# Patient Record
Sex: Female | Born: 1990 | Race: Black or African American | Hispanic: No | Marital: Married | Smoking: Never smoker
Health system: Southern US, Community
[De-identification: ages and names within clinical notes are randomized; demographics above are authoritative.]

## PROBLEM LIST (undated history)

## (undated) DIAGNOSIS — O26613 Liver and biliary tract disorders in pregnancy, third trimester: Secondary | ICD-10-CM

## (undated) DIAGNOSIS — I456 Pre-excitation syndrome: Secondary | ICD-10-CM

## (undated) DIAGNOSIS — F419 Anxiety disorder, unspecified: Secondary | ICD-10-CM

## (undated) DIAGNOSIS — K831 Obstruction of bile duct: Secondary | ICD-10-CM

## (undated) DIAGNOSIS — Z789 Other specified health status: Secondary | ICD-10-CM

## (undated) DIAGNOSIS — O26643 Intrahepatic cholestasis of pregnancy, third trimester: Secondary | ICD-10-CM

## (undated) DIAGNOSIS — E669 Obesity, unspecified: Secondary | ICD-10-CM

## (undated) DIAGNOSIS — I10 Essential (primary) hypertension: Secondary | ICD-10-CM

## (undated) HISTORY — PX: CARDIAC ELECTROPHYSIOLOGY MAPPING AND ABLATION: SHX1292

## (undated) HISTORY — PX: OTHER SURGICAL HISTORY: SHX169

---
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
## 2018-09-19 ENCOUNTER — Ambulatory Visit
Admission: EM | Admit: 2018-09-19 | Discharge: 2018-09-19 | Disposition: A | Discharge status: Home / Self Care | Payer: Self-pay | Attending: Urgent Care | Admitting: Urgent Care

## 2018-09-19 ENCOUNTER — Other Ambulatory Visit: Payer: Self-pay

## 2018-09-19 DIAGNOSIS — H6123 Impacted cerumen, bilateral: Secondary | ICD-10-CM

## 2018-09-19 DIAGNOSIS — H60503 Unspecified acute noninfective otitis externa, bilateral: Secondary | ICD-10-CM

## 2018-09-19 MED ORDER — NEOMYCIN-POLYMYXIN-HC 3.5-10000-1 OT SUSP: 4.0000 [drp] | Freq: Three times a day (TID) | OTIC | 0 refills | Status: DC

## 2018-09-19 NOTE — Discharge Instructions (Addendum)
It was very nice seeing you today in clinic. Thank you for entrusting me with your care.   Please utilize the medications that we discussed. Your prescriptions have been called in to your pharmacy. May use Tylenol and/or Ibuprofen as needed for pain/fever.   Make arrangements to follow up with your regular doctor in 1 week for re-evaluation if not improving. If your symptoms/condition worsens, please seek follow up care either here or in the ER. Please remember, our [place] providers are "right here with you" when you need [place].   Again, it was my pleasure to take care of you today. Thank you for choosing our clinic. I hope that you start to feel better quickly.   Honor Loh, MSN, APRN, FNP-C, CEN Advanced Practice Provider [facility]

## 2018-09-19 NOTE — ED Triage Notes (Signed)
Pt here for bilateral pain in her ears and both are clogged and has been trying to get them unclogged for 1 week. Has tried debrox and warm olive oil without relief.

## 2018-09-19 NOTE — ED Provider Notes (Signed)
[place], [place]   Name: Suzanne Guzman DOB: Apr 09, 1990 MRN: [id number] CSN: [id number] PCP: Patient, No Pcp Per  Arrival date and time:  09/19/18 1145  Chief Complaint:  Ear Problem   NOTE: Prior to seeing the patient today, I have reviewed the triage nursing documentation and vital signs. Clinical staff has updated patient's PMH/PSHx, current medication list, and drug allergies/intolerances to ensure comprehensive history available to assist in medical decision making.   History:   HPI: Suzanne Guzman is a 28 y.o. female who presents today with complaints of BILATERAL otalgia and decreased ability to hear over the last week. Patient denies any fevers. No recent upper respiratory illnesses of forceful nose blowing. Patient has not experienced any otorrhea. Patient feels like her ears are "clogged up". (+) tinnitus. She has been using Debrox and warm olive oil, however she has not appreciated any difference in her symptoms. Patient denies the use of cotton tip swabs. She does not have a history of ear problems or tympanostomy tubes.   History reviewed. No pertinent past medical history.  Past Surgical History:  Procedure Laterality Date   ablation     heart    Family History  Problem Relation Age of Onset   Cancer Mother    Other Father        MDS    Social History   Tobacco Use   Smoking status: Never Smoker   Smokeless tobacco: Never Used  Substance Use Topics   Alcohol use: Yes   Drug use: Not on file    There are no active problems to display for this patient.   Home Medications:    No outpatient medications have been marked as taking for the 09/19/18 encounter [facility] Encounter).    Allergies:   Penicillins  Review of Systems (ROS): Review of Systems  Constitutional: Negative for chills and fever.  HENT: Positive for ear pain, hearing loss (decreased) and tinnitus. Negative for congestion, ear discharge, rhinorrhea, sinus pressure, sinus pain and sore  throat.   Respiratory: Negative for cough and shortness of breath.   Cardiovascular: Negative for chest pain and palpitations.  Musculoskeletal: Negative for neck pain.  Skin: Negative.   Neurological: Negative for dizziness and headaches.  Hematological: Negative for adenopathy.     Vital Signs: Today's Vitals   09/19/18 1221 09/19/18 1223 09/19/18 1332  BP: (!) 148/91    Pulse: 95    Resp: 18    Temp: 99.1 F (37.3 C)    TempSrc: Oral    SpO2: 100%    Weight:  190 lb (86.2 kg)   Height:  5\' 3"  (1.6 m)   PainSc:  0-No pain 0-No pain    Physical Exam: Physical Exam  Constitutional: She is oriented to person, place, and time and well-developed, well-nourished, and in no distress.  HENT:  Head: Normocephalic and atraumatic.  Right Ear: There is tenderness. No drainage. Decreased hearing is noted.  Left Ear: There is tenderness. No drainage. Decreased hearing is noted.  Nose: Nose normal. No sinus tenderness.  Mouth/Throat: Uvula is midline, oropharynx is clear and moist and mucous membranes are normal.  EAC fully occluded by impacted cerumen BILATERALLY  Eyes: Pupils are equal, round, and reactive to light. EOM are normal.  Neck: Normal range of motion. Neck supple.  Cardiovascular: Normal rate, regular rhythm, normal heart sounds and intact distal pulses. Exam reveals no gallop and no friction rub.  No murmur heard. Pulmonary/Chest: Effort normal and breath sounds normal. No respiratory distress. She  has no wheezes. She has no rales.  Lymphadenopathy:    She has no cervical adenopathy.  Neurological: She is alert and oriented to person, place, and time. Gait normal. GCS score is 15.  Skin: Skin is warm and dry. No rash noted. No erythema.  Psychiatric: Mood, memory, affect and judgment normal.  Nursing note and vitals reviewed.   Urgent Care Treatments / Results:   LABS: PLEASE NOTE: all labs that were ordered this encounter are listed, however only abnormal results  are displayed. Labs Reviewed - No data to display  EKG: -None  RADIOLOGY: No results found.  PROCEDURES: Ear Cerumen Removal Performed by: Karen Kitchens, NP Authorized by: Karen Kitchens, NP   Consent:    Consent obtained:  Verbal   Consent given by:  Patient   Risks discussed:  Bleeding, infection, pain, dizziness, incomplete removal and TM perforation   Alternatives discussed:  Delayed treatment, alternative treatment and referral Procedure details:    Location:  L ear and R ear   Procedure type comment:  Irrigated with warm water and disimpacted with curette Post-procedure details:    Inspection:  Bleeding, macerated skin and TM intact   Hearing quality:  Improved   Patient tolerance of procedure:  Tolerated well, no immediate complications    MEDICATIONS RECEIVED THIS VISIT: Medications - No data to display  PERTINENT CLINICAL COURSE NOTES/UPDATES:   Initial Impression / Assessment and Plan / Urgent Care Course:  Pertinent labs & imaging results that were available during my care of the patient were personally reviewed by me and considered in my medical decision making (see lab/imaging section of note for values and interpretations).  Suzanne Guzman is a 28 y.o. female who presents to [facility] today with complaints of Ear Problem   Patient overall well appearing and in no acute distress today in clinic. Exam reveals reveals BILATERAL external auditory canal occulusion secondary to cerumen impaction. Ears are painful. No recent illnesses. No fevers. Ears disimpacted as per above. Post-procedural examination of the ears reveals erythematous maceration within the auditory canals. Slight bleeding noted (R>L). Discussed need for treatment for pain and swelling. Reviewed high likelihood of developing external ear infection with the degree of maceration present. Will treat with Cortisporin otic gtts TID x 5 days. May use Tylenol and/or Ibuprofen as needed for pain/fever.  She was encouraged not to introduce anything into the ears other than the drops. Discussed safe ear cleaning, which included avoiding cotton tip swabs.    Discussed follow up with primary care physician in 1 week for re-evaluation. I have reviewed the follow up and strict return precautions for any new or worsening symptoms. Patient is aware of symptoms that would be deemed urgent/emergent, and would thus require further evaluation either here or in the emergency department. At the time of discharge, she verbalized understanding and consent with the discharge plan as it was reviewed with her. All questions were fielded by provider and/or clinic staff prior to patient discharge.    Final Clinical Impressions(s) / Urgent Care Diagnoses:   Final diagnoses:  Bilateral impacted cerumen  Acute otitis externa of both ears, unspecified type    New Prescriptions:   Meds ordered this encounter  Medications   neomycin-polymyxin-hydrocortisone (CORTISPORIN) 3.5-10000-1 OTIC suspension    Sig: Place 4 drops into both ears 3 (three) times daily. X 5 days    Dispense:  10 mL    Refill:  0    Controlled Substance Prescriptions:  [place] Controlled  Substance Registry consulted? Not Applicable  Recommended Follow up Care:  Patient encouraged to follow up with the following provider within the specified time frame, or sooner as dictated by the severity of her symptoms. As always, she was instructed that for any urgent/emergent care needs, she should seek care either here or in the emergency department for more immediate evaluation.  Follow-up Information    PCP In 1 week.          NOTE: This note was prepared using Lobbyist along with smaller Company secretary. Despite my best ability to proofread, there is the potential that transcriptional errors may still occur from this process, and are completely unintentional.     Karen Kitchens, NP 09/20/18 2241

## 2018-11-15 ENCOUNTER — Encounter: Payer: Self-pay | Admitting: Emergency Medicine

## 2018-11-15 ENCOUNTER — Ambulatory Visit
Admission: EM | Admit: 2018-11-15 | Discharge: 2018-11-15 | Disposition: A | Discharge status: Home / Self Care | Payer: Self-pay | Attending: Family Medicine | Admitting: Family Medicine

## 2018-11-15 ENCOUNTER — Other Ambulatory Visit: Payer: Self-pay

## 2018-11-15 DIAGNOSIS — N943 Premenstrual tension syndrome: Secondary | ICD-10-CM

## 2018-11-15 LAB — HCG, QUANTITATIVE, PREGNANCY: hCG, Beta Chain, Quant, S: <1 m[IU]/mL (ref <5)

## 2018-11-15 MED ORDER — MEFENAMIC ACID 250 MG PO CAPS: 500.0000 mg | ORAL_CAPSULE | Freq: Three times a day (TID) | ORAL | 1 refills | Status: DC | PRN

## 2018-11-15 NOTE — Discharge Instructions (Addendum)
Medication as needed.  Fol

## 2018-11-15 NOTE — ED Triage Notes (Signed)
Patient c/o lower mid abdominal pain that started 2-3 days ago.  Patient denies N/V/D.  Patient denies fevers. Patient denies any urinary symptoms.

## 2018-11-15 NOTE — ED Provider Notes (Signed)
[facility]    CSN: [id number] Arrival date & time: 11/15/18  1321   History   Chief Complaint Chief Complaint  Patient presents with  . Abdominal Pain   HPI  28 year old female presents with multiple complaints.  Patient reports that she feels that she is experiencing premenstrual syndrome.  She reports lower abdominal pain which she describes as cramping and pressure.  She reports vaginal aching and breast pain as well.  She states that she is getting ready to start her menstrual cycle on Monday.  She has had similar bouts of this previously but states that the pain feels different.  She has taken several over-the-counter medications without improvement.  Patient states that she is had a negative pregnancy test at home.  She states that her pain is currently 5/10 in severity.  She is requesting something for her abdominal discomfort/cramping.  No fever.  No GI symptoms.  No urinary symptoms.  No other complaints.  PMH, Surgical Hx, Family Hx, Social History reviewed and updated as below.  No significant PMH.  Past Surgical History:  Procedure Laterality Date  . ablation     heart    OB History   No obstetric history on file.      Home Medications    Prior to Admission medications   Medication Sig Start Date End Date Taking? Authorizing Provider  Mefenamic Acid 250 MG CAPS Take 2 capsules (500 mg total) by mouth 3 (three) times daily as needed (Pain). 11/15/18   Coral Spikes, DO    Family History Family History  Problem Relation Age of Onset  . Cancer Mother   . Other Father        MDS    Social History Social History   Tobacco Use  . Smoking status: Never Smoker  . Smokeless tobacco: Never Used  Substance Use Topics  . Alcohol use: Yes  . Drug use: Never     Allergies   Penicillins   Review of Systems Review of Systems  Constitutional: Negative.   Gastrointestinal: Positive for abdominal pain.  Genitourinary: Negative.   Breast pain  Vaginal "achy"  Physical Exam Triage Vital Signs ED Triage Vitals  Enc Vitals Group     BP 11/15/18 1333 (!) 148/97     Pulse Rate 11/15/18 1333 88     Resp 11/15/18 1333 16     Temp 11/15/18 1333 98.7 F (37.1 C)     Temp Source 11/15/18 1333 Oral     SpO2 11/15/18 1333 100 %     Weight 11/15/18 1330 190 lb (86.2 kg)     Height 11/15/18 1330 5\' 3"  (1.6 m)     Head Circumference --      Peak Flow --      Pain Score 11/15/18 1330 5     Pain Loc --      Pain Edu? --      Excl. in [place]? --    Updated Vital Signs BP (!) 148/97 (BP Location: Left Arm)   Pulse 88   Temp 98.7 F (37.1 C) (Oral)   Resp 16   Ht 5\' 3"  (1.6 m)   Wt 86.2 kg   LMP 10/21/2018 (Exact Date)   SpO2 100%   BMI 33.66 kg/m   Visual Acuity Right Eye Distance:   Left Eye Distance:   Bilateral Distance:    Right Eye Near:   Left Eye Near:    Bilateral Near:     Physical Exam Vitals  signs and nursing note reviewed.  Constitutional:      General: She is not in acute distress.    Appearance: Normal appearance.  HENT:     Head: Normocephalic and atraumatic.  Eyes:     General:        Right eye: No discharge.        Left eye: No discharge.     Conjunctiva/sclera: Conjunctivae normal.  Cardiovascular:     Rate and Rhythm: Normal rate and regular rhythm.  Pulmonary:     Effort: Pulmonary effort is normal.     Breath sounds: Normal breath sounds. No wheezing, rhonchi or rales.  Abdominal:     General: There is no distension.     Palpations: Abdomen is soft.     Comments: Tender to palpation in the suprapubic region.  Neurological:     Mental Status: She is alert.  Psychiatric:        Mood and Affect: Mood normal.        Behavior: Behavior normal.     UC Treatments / Results  Labs (all labs ordered are listed, but only abnormal results are displayed) Labs Reviewed  HCG, QUANTITATIVE, PREGNANCY    EKG   Radiology No results found.  Procedures Procedures (including critical care  time)  Medications Ordered in UC Medications - No data to display  Initial Impression / Assessment and Plan / UC Course  I have reviewed the triage vital signs and the nursing notes.  Pertinent labs & imaging results that were available during my care of the patient were reviewed by me and considered in my medical decision making (see chart for details).    28 year old female presents with premenstrual syndrome.  Mefenamic acid as needed.  Pregnancy test negative today.  Supportive care.  Final Clinical Impressions(s) / UC Diagnoses   Final diagnoses:  Premenstrual syndrome     Discharge Instructions     Medication as needed.  [facility]   ED Prescriptions    Medication Sig Dispense Auth. Provider   Mefenamic Acid 250 MG CAPS Take 2 capsules (500 mg total) by mouth 3 (three) times daily as needed (Pain). 30 capsule Coral Spikes, DO     Controlled Substance Prescriptions [place] Controlled Substance Registry consulted? Not Applicable   Coral Spikes, DO 11/15/18 1515

## 2019-02-18 ENCOUNTER — Telehealth: Payer: Self-pay | Admitting: Obstetrics & Gynecology

## 2019-02-18 NOTE — Telephone Encounter (Signed)
Patient called after hour nurse line on 02/12/19 at 6:46 requesting an appointment for NOB. Our office was closed due to thanksgiving hoilday. I attempt to reach patient to schedule appointment no answer left voice mail for her to call back to be schedule.

## 2019-02-24 ENCOUNTER — Telehealth: Payer: Self-pay

## 2019-02-24 NOTE — Telephone Encounter (Signed)
Pt calling; is 6w preg; has initial appt 12/24; yesterday am had pink d/c; later in day with wiping had two episodes of brown d/c; this am had a little bit of brown d/c with back and abd cramping.  [phone]  Pt states had IC yesterday before this started; states the brown later in the day was chunkier; with the pink d/c there was not chunks. Adv all of this is probably d/t IC.  Pt asked if appt could be moved up sooner.  Tx'd to [facility] for cancellation list.

## 2019-02-26 ENCOUNTER — Telehealth: Payer: Self-pay

## 2019-02-26 NOTE — Telephone Encounter (Signed)
Can you call her and give her CLG advice please. I dot have a phone out here

## 2019-02-26 NOTE — Telephone Encounter (Signed)
Pt calling triage reporting pain on the Right side pt is [redacted] weeks pregnant, it only last a few min's, but if she sits wrong or gets up fast she feels the pain, we have no openings please advise

## 2019-02-26 NOTE — Telephone Encounter (Signed)
Pt aware. Pt asked if she goes to urgent care if they will be able to see whats going on in there. Advised as far as ultrasounds, I do not know if they have access there. Advised to call before deciding to go to urgent care.

## 2019-02-26 NOTE — Telephone Encounter (Signed)
Please have her go to the ER or urgent care if she needs to be seen prior to her NOB appointment

## 2019-03-03 DIAGNOSIS — O26851 Spotting complicating pregnancy, first trimester: Secondary | ICD-10-CM | POA: Diagnosis not present

## 2019-03-03 DIAGNOSIS — J45909 Unspecified asthma, uncomplicated: Secondary | ICD-10-CM | POA: Diagnosis not present

## 2019-03-03 DIAGNOSIS — O99511 Diseases of the respiratory system complicating pregnancy, first trimester: Secondary | ICD-10-CM | POA: Diagnosis not present

## 2019-03-03 DIAGNOSIS — O99411 Diseases of the circulatory system complicating pregnancy, first trimester: Secondary | ICD-10-CM | POA: Diagnosis not present

## 2019-03-03 DIAGNOSIS — Z3A01 Less than 8 weeks gestation of pregnancy: Secondary | ICD-10-CM | POA: Diagnosis not present

## 2019-03-03 DIAGNOSIS — I456 Pre-excitation syndrome: Secondary | ICD-10-CM | POA: Diagnosis not present

## 2019-03-03 DIAGNOSIS — O209 Hemorrhage in early pregnancy, unspecified: Secondary | ICD-10-CM | POA: Diagnosis not present

## 2019-03-11 ENCOUNTER — Telehealth: Payer: Self-pay

## 2019-03-11 NOTE — Telephone Encounter (Signed)
Returned Suzanne Guzman's call and her questions were answered. Had an ultrasound last week at [facility] a [facility] with [place] 6wk4d with +FCA. Had two small fibroids. No adnexal masses. Having some mild cramping with some white discharge. Wet prep and GC/Chlamydia tests at [facility] last week were negative. Advised cramping in early pregnancy may be normal. Questions regarding practice at [facility] were answered. Dalia Heading, CNM

## 2019-03-11 NOTE — Telephone Encounter (Signed)
Pt feels cramps in the front of her stomach and a white discharge, no odor , pt aware this is fine, as long as the pain is not severe or no bleeding. Cramps can be normal in the beginning of pregnancy along with a heavy discharge  she has an appointment in 2 days, wanted me to send to a DR to make sure there wasn't anything else that could be going on.

## 2019-03-13 ENCOUNTER — Encounter: Payer: Self-pay | Admitting: Advanced Practice Midwife

## 2019-03-13 ENCOUNTER — Other Ambulatory Visit: Payer: Self-pay

## 2019-03-13 ENCOUNTER — Encounter

## 2019-03-13 ENCOUNTER — Ambulatory Visit (INDEPENDENT_AMBULATORY_CARE_PROVIDER_SITE_OTHER): Payer: BLUE CROSS/BLUE SHIELD | Admitting: Advanced Practice Midwife

## 2019-03-13 ENCOUNTER — Other Ambulatory Visit (HOSPITAL_COMMUNITY): Admission: RE | Admit: 2019-03-13 | Payer: BLUE CROSS/BLUE SHIELD | Source: Ambulatory Visit

## 2019-03-13 VITALS — BP 145/88 | HR 88 | Wt 214.0 lb

## 2019-03-13 DIAGNOSIS — Z3A08 8 weeks gestation of pregnancy: Secondary | ICD-10-CM

## 2019-03-13 DIAGNOSIS — Z131 Encounter for screening for diabetes mellitus: Secondary | ICD-10-CM

## 2019-03-13 DIAGNOSIS — Z3480 Encounter for supervision of other normal pregnancy, unspecified trimester: Secondary | ICD-10-CM | POA: Diagnosis not present

## 2019-03-13 DIAGNOSIS — O99210 Obesity complicating pregnancy, unspecified trimester: Secondary | ICD-10-CM

## 2019-03-13 DIAGNOSIS — Z124 Encounter for screening for malignant neoplasm of cervix: Secondary | ICD-10-CM

## 2019-03-13 DIAGNOSIS — O0990 Supervision of high risk pregnancy, unspecified, unspecified trimester: Secondary | ICD-10-CM | POA: Insufficient documentation

## 2019-03-13 NOTE — Progress Notes (Signed)
New Obstetric Patient H&P    Chief Complaint: "Desires prenatal care"   History of Present Illness: Patient is a 28 y.o. G2P0010 Not Hispanic or Latino female, presents with amenorrhea and positive home pregnancy test. Patient's last menstrual period was 01/14/2019 (exact date). and based on her  LMP, her EDD is Estimated Date of Delivery: 10/21/19 and her EGA is [redacted]w[redacted]d. Cycles are 6. days, regular, and occur approximately every : 28 days. Her last pap smear was 2 or 3 years ago in [place] and was no abnormalities.    She had a urine pregnancy test which was positive 4 week(s)  ago. Her last menstrual period was normal and lasted for  5 or 6 day(s). Since her LMP she claims she has experienced breast tenderness, fatigue, nausea. She had spotting a week and a half ago and was seen at [facility]. Ultrasound was done dating IUP at [redacted]w[redacted]d with cardiac activity. She also had some labs done including Aptima. Her past medical history is contributory for Wolff-Parkinson White Syndrome with cardiac ablation 2007.She does not have any restrictions per cardiologist. Her prior pregnancies are notable for TAB in 2016.  Since her LMP, she admits to the use of tobacco products  no She claims she has gained  4 pounds since the start of her pregnancy.  There are cats in the home in the home  no  She admits close contact with children on a regular basis  yes teacher at daycare She has had chicken pox in the past yes She has had Tuberculosis exposures, symptoms, or previously tested positive for TB   no Current or past history of domestic violence. no  Genetic Screening/Teratology Counseling: (Includes patient, baby's father, or anyone in either family with:)   44. Patient's age >/= 25 at [facility]  no 2. Thalassemia ([place], [place], [place], or Asian background): MCV<80  no 3. Neural tube defect (meningomyelocele, spina bifida, anencephaly)  no 4. Congenital heart defect  no  5. Down syndrome  no 6. Tay-Sachs (Jewish,  [place])  no 7. Canavan's Disease  no 8. Sickle cell disease or trait (African)  no  9. Hemophilia or other blood disorders  no  10. Muscular dystrophy  no  11. Cystic fibrosis  no  12. Huntington's Chorea  no  13. Mental retardation/autism  no 14. Other inherited genetic or chromosomal disorder  no 15. Maternal metabolic disorder (DM, PKU, etc)  no 16. Patient or FOB with a child with a birth defect not listed above no  16a. Patient or FOB with a birth defect themselves no 17. Recurrent pregnancy loss, or stillbirth  no  18. Any medications since LMP other than prenatal vitamins (include vitamins, supplements, OTC meds, drugs, alcohol)  no 19. Any other genetic/environmental exposure to discuss  no  Infection History:   1. Lives with someone with TB or TB exposed  no  2. Patient or partner has history of genital herpes  no 3. Rash or viral illness since LMP  no 4. History of STI (GC, CT, HPV, syphilis, HIV)  no 5. History of recent travel :  no  Other pertinent information:  She denies a history of chronic or essential hypertension   Review of Systems:10 point review of systems negative unless otherwise noted in HPI  Past Medical History:  History reviewed. No pertinent past medical history.  Past Surgical History:  Past Surgical History:  Procedure Laterality Date  . ablation     heart    Gynecologic  History: Patient's last menstrual period was 01/14/2019 (exact date).  Obstetric History: G2P0010  Family History:  Family History  Problem Relation Age of Onset  . Cancer- Breast Mother 62  . Other Father        MDS    Social History:  Social History   Socioeconomic History  . Marital status: Single    Spouse name: Not on file  . Number of children: Not on file  . Years of education: Not on file  . Highest education level: Not on file  Occupational History  . Not on file  Tobacco Use  . Smoking status: Never Smoker  . Smokeless tobacco: Never  Used  Substance and Sexual Activity  . Alcohol use: Yes  . Drug use: Never  . Sexual activity: Not on file  Other Topics Concern  . Not on file  Social History Narrative  . Not on file   Social Determinants of Health   Financial Resource Strain:   . Difficulty of Paying Living Expenses: Not on file  Food Insecurity:   . Worried About Charity fundraiser in the Last Year: Not on file  . Ran Out of Food in the Last Year: Not on file  Transportation Needs:   . Lack of Transportation (Medical): Not on file  . Lack of Transportation (Non-Medical): Not on file  Physical Activity:   . Days of Exercise per Week: Not on file  . Minutes of Exercise per Session: Not on file  Stress:   . Feeling of Stress : Not on file  Social Connections:   . Frequency of Communication with Friends and Family: Not on file  . Frequency of Social Gatherings with Friends and Family: Not on file  . Attends Religious Services: Not on file  . Active Member of Clubs or Organizations: Not on file  . Attends Archivist Meetings: Not on file  . Marital Status: Not on file  Intimate Partner Violence:   . Fear of Current or Ex-Partner: Not on file  . Emotionally Abused: Not on file  . Physically Abused: Not on file  . Sexually Abused: Not on file    Allergies:  Allergies  Allergen Reactions  . Nutmeg Oil (Myristica Oil) Anaphylaxis    Allergy to NUTS  . Penicillins Anaphylaxis    Medications: Prior to Admission medications   Not on File    Physical Exam Vitals: Blood pressure (!) 145/88, pulse 88, weight 214 lb (97.1 kg), last menstrual period 01/14/2019. Recheck of blood pressure 138/86  General: NAD HEENT: normocephalic, anicteric Thyroid: no enlargement, no palpable nodules Pulmonary: No increased work of breathing, CTAB Cardiovascular: RRR, distal pulses 2+ Abdomen: NABS, soft, non-tender, non-distended.  Umbilicus without lesions.  No hepatomegaly, splenomegaly or masses palpable.  No evidence of hernia  Genitourinary:  External: Normal external female genitalia.  Normal urethral meatus, normal  Bartholin's and Skene's glands.    Vagina: Normal vaginal mucosa, no evidence of prolapse.    Cervix: Grossly normal in appearance, no bleeding, no CMT  Uterus:  Non-enlarged, mobile, normal contour.    Adnexa: ovaries non-enlarged, no adnexal masses  Rectal: deferred Extremities: no edema, erythema, or tenderness Neurologic: Grossly intact Psychiatric: mood appropriate, affect full   The following were addressed during this visit:  Breastfeeding Education - Early initiation of breastfeeding    Comments: Keeps milk supply adequate, helps contract uterus and slow bleeding, and early milk is the perfect first food and is easy to digest.   -  The importance of exclusive breastfeeding    Comments: Provides antibodies, Lower risk of breast and ovarian cancers, and type-2 diabetes,Helps your body recover, Reduced chance of SIDS.   - Risks of giving your baby anything other than breast milk if you are breastfeeding    Comments: Make the baby less content with breastfeeds, may make my baby more susceptible to illness, and may reduce my milk supply.   - The importance of early skin-to-skin contact    Comments: Keeps baby warm and secure, helps keep baby's blood sugar up and breathing steady, easier to bond and breastfeed, and helps calm baby.  - Feeding on demand or baby-led feeding    Comments: Helps prevent breastfeeding complications, helps bring in good milk supply, prevents under or overfeeding, and helps baby feel content and satisfied   - Frequent feeding to help assure optimal milk production    Comments: Making a full supply of milk requires frequent removal of milk from breasts, infant will eat 8-12 times in 24 hours, if separated from infant use breast massage, hand expression and/ or pumping to remove milk from breasts.   - Effective positioning and attachment     Comments: Helps my baby to get enough breast milk, helps to produce an adequate milk supply, and helps prevent nipple pain and damage   - Exclusive breastfeeding for the first 6 months    Comments: Builds a healthy milk supply and keeps it up, protects baby from sickness and disease, and breastmilk has everything your baby needs for the first 6 months.   Assessment: 28 y.o. G2P0010 at [redacted]w[redacted]d presenting to initiate prenatal care  Plan: 1) Avoid alcoholic beverages. 2) Patient encouraged not to smoke.  3) Discontinue the use of all non-medicinal drugs and chemicals.  4) Take prenatal vitamins daily.  5) Nutrition, food safety (fish, cheese advisories, and high nitrite foods) and exercise discussed. 6) Hospital and practice style discussed with cross coverage system.  7) Genetic Screening, such as with 1st Trimester Screening, cell free fetal DNA, AFP testing, and Ultrasound, as well as with amniocentesis and CVS as appropriate, is discussed with patient. At the conclusion of today's visit patient requested genetic testing 8) Patient is asked about travel to areas at risk for the Zika virus, and counseled to avoid travel and exposure to mosquitoes or sexual partners who may have themselves been exposed to the virus. Testing is discussed, and will be ordered as appropriate.  9) PAP, urine culture today 10) Return to clinic in 2 weeks for viability scan, early 1 hr gtt, NOB panel, sickle cell screen, MaterniT 21 if dates agree 27) Consider baseline PIH labs for elevated BP at NOB 12) Consider cardiac Echo for history of [place] 03/13/2019, 11:02 AM

## 2019-03-13 NOTE — Progress Notes (Signed)
NOB Nausea B/P recheck 138/86

## 2019-03-13 NOTE — Patient Instructions (Signed)
Morning Sickness  Morning sickness is when a woman feels nauseous during pregnancy. This nauseous feeling may or may not come with vomiting. It often occurs in the morning, but it can be a problem at any time of day. Morning sickness is most common during the first trimester. In some cases, it may continue throughout pregnancy. Although morning sickness is unpleasant, it is usually harmless unless the woman develops severe and continual vomiting (hyperemesis gravidarum), a condition that requires more intense treatment. What are the causes? The exact cause of this condition is not known, but it seems to be related to normal hormonal changes that occur in pregnancy. What increases the risk? You are more likely to develop this condition if:  You experienced nausea or vomiting before your pregnancy.  You had morning sickness during a previous pregnancy.  You are pregnant with more than one baby, such as twins. What are the signs or symptoms? Symptoms of this condition include:  Nausea.  Vomiting. How is this diagnosed? This condition is usually diagnosed based on your signs and symptoms. How is this treated? In many cases, treatment is not needed for this condition. Making some changes to what you eat may help to control symptoms. Your health care provider may also prescribe or recommend:  Vitamin B6 supplements.  Anti-nausea medicines.  Ginger. Follow these instructions at home: Medicines  Take over-the-counter and prescription medicines only as told by your health care provider. Do not use any prescription, over-the-counter, or herbal medicines for morning sickness without first talking with your health care provider.  Taking multivitamins before getting pregnant can prevent or decrease the severity of morning sickness in most women. Eating and drinking  Eat a piece of dry toast or crackers before getting out of bed in the morning.  Eat 5 or 6 small meals a day.  Eat dry and  bland foods, such as rice or a baked potato. Foods that are high in carbohydrates are often helpful.  Avoid greasy, fatty, and spicy foods.  Have someone cook for you if the smell of any food causes nausea and vomiting.  If you feel nauseous after taking prenatal vitamins, take the vitamins at night or with a snack.  Snack on protein foods between meals if you are hungry. Nuts, yogurt, and cheese are good options.  Drink fluids throughout the day.  Try ginger ale made with real ginger, ginger tea made from fresh grated ginger, or ginger candies. General instructions  Do not use any products that contain nicotine or tobacco, such as cigarettes and e-cigarettes. If you need help quitting, ask your health care provider.  Get an air purifier to keep the air in your house free of odors.  Get plenty of fresh air.  Try to avoid odors that trigger your nausea.  Consider trying these methods to help relieve symptoms: ? Wearing an acupressure wristband. These wristbands are often worn for seasickness. ? Acupuncture. Contact a health care provider if:  Your home remedies are not working and you need medicine.  You feel dizzy or light-headed.  You are losing weight. Get help right away if:  You have persistent and uncontrolled nausea and vomiting.  You faint.  You have severe pain in your abdomen. Summary  Morning sickness is when a woman feels nauseous during pregnancy. This nauseous feeling may or may not come with vomiting.  Morning sickness is most common during the first trimester.  It often occurs in the morning, but it can be a problem at  any time of day.  In many cases, treatment is not needed for this condition. Making some changes to what you eat may help to control symptoms. This information is not intended to replace advice given to you by your health care provider. Make sure you discuss any questions you have with your health care provider. Document Released:  04/27/2006 Document Revised: 02/16/2017 Document Reviewed: 04/08/2016 Elsevier Patient Education  2020 [employer]. Exercise During Pregnancy Exercise is an important part of being healthy for people of all ages. Exercise improves the function of your heart and lungs and helps you maintain strength, flexibility, and a healthy body weight. Exercise also boosts energy levels and elevates mood. Most women should exercise regularly during pregnancy. In rare cases, women with certain medical conditions or complications may be asked to limit or avoid exercise during pregnancy. How does this affect me? Along with maintaining general strength and flexibility, exercising during pregnancy can help:  Keep strength in muscles that are used during labor and childbirth.  Decrease low back pain.  Reduce symptoms of depression.  Control weight gain during pregnancy.  Reduce the risk of needing insulin if you develop diabetes during pregnancy.  Decrease the risk of cesarean delivery.  Speed up your recovery after giving birth. How does this affect my baby? Exercise can help you have a healthy pregnancy. Exercise does not cause premature birth. It will not cause your baby to weigh less at birth. What exercises can I do? Many exercises are safe for you to do during pregnancy. Do a variety of exercises that safely increase your heart and breathing rates and help you build and maintain muscle strength. Do exercises exactly as told by your health care provider. You may do these exercises:  Walking or hiking.  Swimming.  Water aerobics.  Riding a stationary bike.  Strength training.  Modified yoga or Pilates. Tell your instructor that you are pregnant. Avoid overstretching, and avoid lying on your back for long periods of time.  Running or jogging. Only choose this type of exercise if you: ? Ran or jogged regularly before your pregnancy. ? Can run or jog and still talk in complete sentences. What  exercises should I avoid? Depending on your level of fitness and whether you exercised regularly before your pregnancy, you may be told to limit high-intensity exercise. You can tell that you are exercising at a high intensity if you are breathing much harder and faster and cannot hold a conversation while exercising. You must avoid:  Contact sports.  Activities that put you at risk for falling on or being hit in the belly, such as downhill skiing, water skiing, surfing, rock climbing, cycling, gymnastics, and horseback riding.  Scuba diving.  Skydiving.  Yoga or Pilates in a room that is heated to high temperatures.  Jogging or running, unless you ran or jogged regularly before your pregnancy. While jogging or running, you should always be able to talk in full sentences. Do not run or jog so fast that you are unable to have a conversation.  Do not exercise at more than 6,000 feet above sea level (high elevation) if you are not used to exercising at high elevation. How do I exercise in a safe way?   Avoid overheating. Do not exercise in very high temperatures.  Wear loose-fitting, breathable clothes.  Avoid dehydration. Drink enough water before, during, and after exercise to keep your urine pale yellow.  Avoid overstretching. Because of hormone changes during pregnancy, it is easy to  overstretch muscles, tendons, and ligaments during pregnancy.  Start slowly and ask your health care provider to recommend the types of exercise that are safe for you.  Do not exercise to lose weight. Follow these instructions at home:  Exercise on most days or all days of the week. Try to exercise for 30 minutes a day, 5 days a week, unless your health care provider tells you not to.  If you actively exercised before your pregnancy and you are healthy, your health care provider may tell you to continue to do moderate to high-intensity exercise.  If you are just starting to exercise or did not  exercise much before your pregnancy, your health care provider may tell you to do low to moderate-intensity exercise. Questions to ask your health care provider  Is exercise safe for me?  What are signs that I should stop exercising?  Does my health condition mean that I should not exercise during pregnancy?  When should I avoid exercising during pregnancy? Stop exercising and contact a health care provider if: You have any unusual symptoms, such as:  Mild contractions of the uterus or cramps in the abdomen.  Dizziness that does not go away when you rest. Stop exercising and get help right away if: You have any unusual symptoms, such as:  Sudden, severe pain in your low back or your belly.  Mild contractions of the uterus or cramps in the abdomen that do not improve with rest and drinking fluids.  Chest pain.  Bleeding or fluid leaking from your vagina.  Shortness of breath. These symptoms may represent a serious problem that is an emergency. Do not wait to see if the symptoms will go away. Get medical help right away. Call your local emergency services (911 in the [place] Do not drive yourself to the hospital. Summary  Most women should exercise regularly throughout pregnancy. In rare cases, women with certain medical conditions or complications may be asked to limit or avoid exercise during pregnancy.  Do not exercise to lose weight during pregnancy.  Your health care provider will tell you what level of physical activity is right for you.  Stop exercising and contact a health care provider if you have mild contractions of the uterus or cramps in the abdomen. Get help right away if these contractions or cramps do not improve with rest and drinking fluids.  Stop exercising and get help right away if you have sudden, severe pain in your low back or belly, chest pain, shortness of breath, or bleeding or leaking of fluid from your vagina. This information is not intended to  replace advice given to you by your health care provider. Make sure you discuss any questions you have with your health care provider. Document Released: 03/06/2005 Document Revised: 06/27/2018 Document Reviewed: 04/10/2018 Elsevier Patient Education  2020 [place] for Pregnant Women While you are pregnant, your body requires additional nutrition to help support your growing baby. You also have a higher need for some vitamins and minerals, such as folic acid, calcium, iron, and vitamin D. Eating a healthy, well-balanced diet is very important for your health and your baby's health. Your need for extra calories varies for the three 1-month segments of your pregnancy (trimesters). For most women, it is recommended to consume:  150 extra calories a day during the first trimester.  300 extra calories a day during the second trimester.  300 extra calories a day during the third trimester. What are tips for following this  plan?   Do not try to lose weight or go on a diet during pregnancy.  Limit your overall intake of foods that have "empty calories." These are foods that have little nutritional value, such as sweets, desserts, candies, and sugar-sweetened beverages.  Eat a variety of foods (especially fruits and vegetables) to get a full range of vitamins and minerals.  Take a prenatal vitamin to help meet your additional vitamin and mineral needs during pregnancy, specifically for folic acid, iron, calcium, and vitamin D.  Remember to stay active. Ask your health care provider what types of exercise and activities are safe for you.  Practice good food safety and cleanliness. Wash your hands before you eat and after you prepare raw meat. Wash all fruits and vegetables well before peeling or eating. Taking these actions can help to prevent food-borne illnesses that can be very dangerous to your baby, such as listeriosis. Ask your health care provider for more information about  listeriosis. What does 150 extra calories look like? Healthy options that provide 150 extra calories each day could be any of the following:  6-8 oz (170-230 g) of plain low-fat yogurt with  cup of berries.  1 apple with 2 teaspoons (11 g) of peanut butter.  Cut-up vegetables with  cup (60 g) of hummus.  8 oz (230 mL) or 1 cup of low-fat chocolate milk.  1 stick of string cheese with 1 medium orange.  1 peanut butter and jelly sandwich that is made with one slice of whole-wheat bread and 1 tsp (5 g) of peanut butter. For 300 extra calories, you could eat two of those healthy options each day. What is a healthy amount of weight to gain? The right amount of weight gain for you is based on your BMI before you became pregnant. If your BMI:  Was less than 18 (underweight), you should gain 28-40 lb (13-18 kg).  Was 18-24.9 (normal), you should gain 25-35 lb (11-16 kg).  Was 25-29.9 (overweight), you should gain 15-25 lb (7-11 kg).  Was 30 or greater (obese), you should gain 11-20 lb (5-9 kg). What if I am having twins or multiples? Generally, if you are carrying twins or multiples:  You may need to eat 300-600 extra calories a day.  The recommended range for total weight gain is 25-54 lb (11-25 kg), depending on your BMI before pregnancy.  Talk with your health care provider to find out about nutritional needs, weight gain, and exercise that is right for you. What foods can I eat?  Grains All grains. Choose whole grains, such as whole-wheat bread, oatmeal, or brown rice. Vegetables All vegetables. Eat a variety of colors and types of vegetables. Remember to wash your vegetables well before peeling or eating. Fruits All fruits. Eat a variety of colors and types of fruit. Remember to wash your fruits well before peeling or eating. Meats and other protein foods Lean meats, including chicken, [place], fish, and lean cuts of beef, veal, or pork. If you eat fish or seafood, choose  options that are higher in omega-3 fatty acids and lower in mercury, such as salmon, herring, mussels, trout, sardines, pollock, shrimp, crab, and lobster. Tofu. Tempeh. Beans. Eggs. Peanut butter and other nut butters. Make sure that all meats, poultry, and eggs are cooked to food-safe temperatures or "well-done." Two or more servings of fish are recommended each week in order to get the most benefits from omega-3 fatty acids that are found in seafood. Choose fish that are lower  in mercury. You can find more information online:  [employer] Dairy Pasteurized milk and milk alternatives (such as almond milk). Pasteurized yogurt and pasteurized cheese. Cottage cheese. Sour cream. Beverages Water. Juices that contain 100% fruit juice or vegetable juice. Caffeine-free teas and decaffeinated coffee. Drinks that contain caffeine are okay to drink, but it is better to avoid caffeine. Keep your total caffeine intake to less than 200 mg each day (which is 12 oz or 355 mL of coffee, tea, or soda) or the limit as told by your health care provider. Fats and oils Fats and oils are okay to include in moderation. Sweets and desserts Sweets and desserts are okay to include in moderation. Seasoning and other foods All pasteurized condiments. The items listed above may not be a complete list of recommended foods and beverages. Contact your dietitian for more options. The items listed above may not be a complete list of foods and beverages [you/your child] can eat. Contact a dietitian for more information. What foods are not recommended? Vegetables Raw (unpasteurized) vegetable juices. Fruits Unpasteurized fruit juices. Meats and other protein foods Lunch meats, bologna, hot dogs, or other deli meats. (If you must eat those meats, reheat them until they are steaming hot.) Refrigerated pat, meat spreads from a meat counter, smoked seafood that is found in the refrigerated section of a store. Raw or undercooked  meats, poultry, and eggs. Raw fish, such as sushi or sashimi. Fish that have high mercury content, such as tilefish, shark, swordfish, and king mackerel. To learn more about mercury in fish, talk with your health care provider or look for online resources, such as:  [employer] Dairy Raw (unpasteurized) milk and any foods that have raw milk in them. Soft cheeses, such as feta, queso blanco, queso fresco, Brie, Camembert cheeses, blue-veined cheeses, and Panela cheese (unless it is made with pasteurized milk, which must be stated on the label). Beverages Alcohol. Sugar-sweetened beverages, such as sodas, teas, or energy drinks. Seasoning and other foods Homemade fermented foods and drinks, such as pickles, sauerkraut, or kombucha drinks. (Store-bought pasteurized versions of these are okay.) Salads that are made in a store or deli, such as ham salad, chicken salad, egg salad, tuna salad, and seafood salad. The items listed above may not be a complete list of foods and beverages to avoid. Contact your dietitian for more information. The items listed above may not be a complete list of foods and beverages [you/your child] should avoid. Contact a dietitian for more information. Where to find more information To calculate the number of calories you need based on your height, weight, and activity level, you can use an online calculator such as:  MobileTransition.ch To calculate how much weight you should gain during pregnancy, you can use an online pregnancy weight gain calculator such as:  [employer]cy Summary  While you are pregnant, your body requires additional nutrition to help support your growing baby.  Eat a variety of foods, especially fruits and vegetables to get a full range of vitamins and minerals.  Practice good food safety and cleanliness. Wash your hands before you eat and after you prepare raw meat. Wash all fruits and  vegetables well before peeling or eating. Taking these actions can help to prevent food-borne illnesses, such as listeriosis, that can be very dangerous to your baby.  Do not eat raw meat or fish. Do not eat fish that have high mercury content, such as tilefish, shark, swordfish, and king mackerel. Do not eat unpasteurized (raw) dairy.  Take a prenatal vitamin to help meet your additional vitamin and mineral needs during pregnancy, specifically for folic acid, iron, calcium, and vitamin D. This information is not intended to replace advice given to you by your health care provider. Make sure you discuss any questions you have with your health care provider. Document Released: 12/19/2013 Document Revised: 06/27/2018 Document Reviewed: 12/01/2016 Elsevier Patient Education  2020 [employer]. Prenatal Care Prenatal care is health care during pregnancy. It helps you and your unborn baby (fetus) stay as healthy as possible. Prenatal care may be provided by a midwife, a family practice health care provider, or a childbirth and pregnancy specialist (obstetrician). How does this affect me? During pregnancy, you will be closely monitored for any new conditions that might develop. To lower your risk of pregnancy complications, you and your health care provider will talk about any underlying conditions you have. How does this affect my baby? Early and consistent prenatal care increases the chance that your baby will be healthy during pregnancy. Prenatal care lowers the risk that your baby will be:  Born early (prematurely).  Smaller than expected at birth (small for gestational age). What can I expect at the first prenatal care visit? Your first prenatal care visit will likely be the longest. You should schedule your first prenatal care visit as soon as you know that you are pregnant. Your first visit is a good time to talk about any questions or concerns you have about pregnancy. At your visit, you and  your health care provider will talk about:  Your medical history, including: ? Any past pregnancies. ? Your family's medical history. ? The baby's father's medical history. ? Any long-term (chronic) health conditions you have and how you manage them. ? Any surgeries or procedures you have had. ? Any current over-the-counter or prescription medicines, herbs, or supplements you are taking.  Other factors that could pose a risk to your baby, including:  Your home setting and your stress levels, including: ? Exposure to abuse or violence. ? Household financial strain. ? Mental health conditions you have.  Your daily health habits, including diet and exercise. Your health care provider will also:  Measure your weight, height, and blood pressure.  Do a physical exam, including a pelvic and breast exam.  Perform blood tests and urine tests to check for: ? Urinary tract infection. ? Sexually transmitted infections (STIs). ? Low iron levels in your blood (anemia). ? Blood type and certain proteins on red blood cells (Rh antibodies). ? Infections and immunity to viruses, such as hepatitis B and rubella. ? HIV (human immunodeficiency virus).  Do an ultrasound to confirm your baby's growth and development and to help predict your estimated due date (EDD). This ultrasound is done with a probe that is inserted into the vagina (transvaginal ultrasound).  Discuss your options for genetic screening.  Give you information about how to keep yourself and your baby healthy, including: ? Nutrition and taking vitamins. ? Physical activity. ? How to manage pregnancy symptoms such as nausea and vomiting (morning sickness). ? Infections and substances that may be harmful to your baby and how to avoid them. ? Food safety. ? Dental care. ? Working. ? Travel. ? Warning signs to watch for and when to call your health care provider. How often will I have prenatal care visits? After your first  prenatal care visit, you will have regular visits throughout your pregnancy. The visit schedule is often as follows:  Up to week 28 of  pregnancy: once every 4 weeks.  28-36 weeks: once every 2 weeks.  After 36 weeks: every week until delivery. Some women may have visits more or less often depending on any underlying health conditions and the health of the baby. Keep all follow-up and prenatal care visits as told by your health care provider. This is important. What happens during routine prenatal care visits? Your health care provider will:  Measure your weight and blood pressure.  Check for fetal heart sounds.  Measure the height of your uterus in your abdomen (fundal height). This may be measured starting around week 20 of pregnancy.  Check the position of your baby inside your uterus.  Ask questions about your diet, sleeping patterns, and whether you can feel the baby move.  Review warning signs to watch for and signs of labor.  Ask about any pregnancy symptoms you are having and how you are dealing with them. Symptoms may include: ? Headaches. ? Nausea and vomiting. ? Vaginal discharge. ? Swelling. ? Fatigue. ? Constipation. ? Any discomfort, including back or pelvic pain. Make a list of questions to ask your health care provider at your routine visits. What tests might I have during prenatal care visits? You may have blood, urine, and imaging tests throughout your pregnancy, such as:  Urine tests to check for glucose, protein, or signs of infection.  Glucose tests to check for a form of diabetes that can develop during pregnancy (gestational diabetes mellitus). This is usually done around week 24 of pregnancy.  An ultrasound to check your baby's growth and development and to check for birth defects. This is usually done around week 20 of pregnancy.  A test to check for group B strep (GBS) infection. This is usually done around week 36 of pregnancy.  Genetic testing.  This may include blood or imaging tests, such as an ultrasound. Some genetic tests are done during the first trimester and some are done during the second trimester. What else can I expect during prenatal care visits? Your health care provider may recommend getting certain vaccines during pregnancy. These may include:  A yearly flu shot (annual influenza vaccine). This is especially important if you will be pregnant during flu season.  Tdap (tetanus, diphtheria, pertussis) vaccine. Getting this vaccine during pregnancy can protect your baby from whooping cough (pertussis) after birth. This vaccine may be recommended between weeks 27 and 36 of pregnancy. Later in your pregnancy, your health care provider may give you information about:  Childbirth and breastfeeding classes.  Choosing a health care provider for your baby.  Umbilical cord banking.  Breastfeeding.  Birth control after your baby is born.  The hospital labor and delivery unit and how to tour it.  Registering at the hospital before you go into labor. Where to find more information  Office on Women's Health: LegalWarrants.gl  American Pregnancy Association: americanpregnancy.org  March of Dimes: marchofdimes.org Summary  Prenatal care helps you and your baby stay as healthy as possible during pregnancy.  Your first prenatal care visit will most likely be the longest.  You will have visits and tests throughout your pregnancy to monitor your health and your baby's health.  Bring a list of questions to your visits to ask your health care provider.  Make sure to keep all follow-up and prenatal care visits with your health care provider. This information is not intended to replace advice given to you by your health care provider. Make sure you discuss any questions you have with your  health care provider. Document Released: 03/09/2003 Document Revised: 06/26/2018 Document Reviewed: 03/05/2017 Elsevier Patient Education   2020 [employer].     COVID-19 and Your Pregnancy FAQ  How can I prevent infection with COVID-19 during my pregnancy? Social distancing is key. Please limit any interactions in public. Try and work from home if possible. Frequently wash your hands after touching possibly contaminated surfaces. Avoid touching your face.  Minimize trips to the store. Consider online ordering when possible.   Should I wear a mask? YES. It is recommended by the CDC that all people wear a cloth mask or facial covering in public. You should wear a mask to your visits in the office. This will help reduce transmission as well as your risk or acquiring COVID-19. New studies are showing that even asymptomatic individuals can spread the virus from talking.   Where can I get a mask? [place] and the city of [place] are partnering to provide masks to community members. You can pick up a mask from several locations. This website also has instructions about how to make a mask by sewing or without sewing by using a t-shirt or bandana.  https://www.[place].gov/i-want-to/learn-about/covid-19-information-and-updates/covid-19-face-mask-project  Studies have shown that if you were a tube or nylon stocking from pantyhose over a cloth mask it makes the cloth mask almost as effective as a N95 mask.  https://www.davis-walter.com/  What are the symptoms of COVID-19? Fever (greater than 100.4 F), dry cough, shortness of breath.  Am I more at risk for COVID-19 since I am pregnant? There is not currently data showing that pregnant women are more adversely impacted by COVID-19 than the general population. However, we know that pregnant women tend to have worse respiratory complications from similar diseases such as the flu and SARS and for this reason should be considered an at-risk population.  What do I do  if I am experiencing the symptoms of COVID-19? Testing is being limited because of test availability. If you are experiencing symptoms you should quarantine yourself, and the members of your family, for at least 2 weeks at home.   Please visit this website for more information: RunningShows.co.za.html  When should I go to the Emergency Room? Please go to the emergency room if you are experiencing ANY of these symptoms*:  1.    Difficulty breathing or shortness of breath 2.    Persistent pain or pressure in the chest 3.    Confusion or difficulty being aroused (or awakened) 4.    Bluish lips or face  *This list is not all inclusive. Please consult our office for any other symptoms that are severe or concerning.  What do I do if I am having difficulty breathing? You should go to the Emergency Room for evaluation. At this time they have a tent set up for evaluating patients with COVID-19 symptoms.   How will my prenatal care be different because of the COVID-19 pandemic? It has been recommended to reduce the frequency of face-to-face visits and use resources such as telephone and virtual visits when possible. Using a scale, blood pressure machine and fetal doppler at home can further help reduce face-to-face visits. You will be provided with additional information on this topic.  We ask that you come to your visits alone to minimize potential exposures to  COVID-19.  How can I receive childbirth education? At this time in-person classes have been cancelled. You can register for online childbirth education, breastfeeding, and newborn care classes.  Please visit:  CyberComps.hu for more  information  How will my hospital birth experience be different? The hospital is currently limiting visitors. This means that while you are in labor you can only have one person at the hospital with you. Additional family members will not be  allowed to wait in the building or outside your room. Your one support person can be the father of the baby, a relative, a doula, or a friend. Once one support person is designated that person will wear a band. This band cannot be shared with multiple people.  Nitrous Gas is not being offered for pain relief since the tubing and filter for the machine can not be sanitized in a way to guarantee prevention of transmission of COVID-19.  Nasal cannula use of oxygen for fetal indications has also been discontinued.  Currently a clear plastic sheet is being hung between mom and the delivering provider during pushing and delivery to help prevent transmission of COVID-19.      How long will I stay in the hospital for after giving birth? It is also recommended that discharge home be expedited during the COVID-19 outbreak. This means staying for 1 day after a vaginal delivery and 2 days after a cesarean section. Patients who need to stay longer for medical reasons are allowed to do so, but the goal will be for expedited discharge home.   What if I have COVID-19 and I am in labor? We ask that you wear a mask while on labor and delivery. We will try and accommodate you being placed in a room that is capable of filtering the air. Please call ahead if you are in labor and on your way to the hospital. The phone number for labor and delivery at [facility] is [phone].  If I have COVID-19 when my baby is born how can I prevent my baby from contracting COVID-19? This is an issue that will have to be discussed on a case-by-case basis. Current recommendations suggest providing separate isolation rooms for both the mother and new infant as well as limiting visitors. However, there are practical challenges to this recommendation. The situation will assuredly change and decisions will be influenced by the desires of the mother and availability of space.  Some suggestions are the use of a curtain  or physical barrier between mom and infant, hand hygiene, mom wearing a mask, or 6 feet of spacing between a mom and infant.   Can I breastfeed during the COVID-19 pandemic?   Yes, breastfeeding is encouraged.  Can I breastfeed if I have COVID-19? Yes. Covid-19 has not been found in breast milk. This means you cannot give COVID-19 to your child through breast milk. Breast feeding will also help pass antibodies to fight infection to your baby.   What precautions should I take when breastfeeding if I have COVID-19? If a mother and newborn do room-in and the mother wishes to feed at the breast, she should put on a facemask and practice hand hygiene before each feeding.  What precautions should I take when pumping if I have COVID-19? Prior to expressing breast milk, mothers should practice hand hygiene. After each pumping session, all parts that come into contact with breast milk should be thoroughly washed and the entire pump should be appropriately disinfected per the manufacturer's instructions. This expressed breast milk should be fed to the newborn by a healthy caregiver.  What if I am pregnant and work in healthcare? Based on limited data regarding COVID-19 and pregnancy, ACOG currently does  not propose creating additional restrictions on pregnant health care personnel because of COVID-19 alone. Pregnant women do not appear to be at higher risk of severe disease related to COVID-19. Pregnant health care personnel should follow CDC risk assessment and infection control guidelines for health care personnel exposed to patients with suspected or confirmed COVID-19. Adherence to recommended infection prevention and control practices is an important part of protecting all health care personnel in health care settings.    Information on COVID-19 in pregnancy is very limited; however, facilities may want to consider limiting exposure of pregnant health care personnel to patients with confirmed or  suspected COVID-19 infection, especially during higher-risk procedures (eg, aerosol-generating procedures), if feasible, based on staffing availability.

## 2019-03-15 LAB — URINE CULTURE

## 2019-03-16 DIAGNOSIS — O209 Hemorrhage in early pregnancy, unspecified: Secondary | ICD-10-CM | POA: Diagnosis not present

## 2019-03-16 DIAGNOSIS — I456 Pre-excitation syndrome: Secondary | ICD-10-CM | POA: Diagnosis not present

## 2019-03-16 DIAGNOSIS — Z9889 Other specified postprocedural states: Secondary | ICD-10-CM | POA: Diagnosis not present

## 2019-03-16 DIAGNOSIS — O99412 Diseases of the circulatory system complicating pregnancy, second trimester: Secondary | ICD-10-CM | POA: Diagnosis not present

## 2019-03-16 DIAGNOSIS — J45909 Unspecified asthma, uncomplicated: Secondary | ICD-10-CM | POA: Diagnosis not present

## 2019-03-16 DIAGNOSIS — O99512 Diseases of the respiratory system complicating pregnancy, second trimester: Secondary | ICD-10-CM | POA: Diagnosis not present

## 2019-03-16 DIAGNOSIS — R109 Unspecified abdominal pain: Secondary | ICD-10-CM | POA: Diagnosis not present

## 2019-03-16 DIAGNOSIS — Z3A08 8 weeks gestation of pregnancy: Secondary | ICD-10-CM | POA: Diagnosis not present

## 2019-03-16 DIAGNOSIS — Z880 Allergy status to penicillin: Secondary | ICD-10-CM | POA: Diagnosis not present

## 2019-03-16 DIAGNOSIS — Z3A09 9 weeks gestation of pregnancy: Secondary | ICD-10-CM | POA: Diagnosis not present

## 2019-03-16 DIAGNOSIS — R110 Nausea: Secondary | ICD-10-CM | POA: Diagnosis not present

## 2019-03-16 DIAGNOSIS — O26851 Spotting complicating pregnancy, first trimester: Secondary | ICD-10-CM | POA: Diagnosis not present

## 2019-03-17 ENCOUNTER — Telehealth: Payer: Self-pay

## 2019-03-17 DIAGNOSIS — O209 Hemorrhage in early pregnancy, unspecified: Secondary | ICD-10-CM | POA: Diagnosis not present

## 2019-03-17 DIAGNOSIS — O26851 Spotting complicating pregnancy, first trimester: Secondary | ICD-10-CM | POA: Diagnosis not present

## 2019-03-17 LAB — CYTOLOGY - PAP: Diagnosis: NEGATIVE

## 2019-03-17 NOTE — Telephone Encounter (Signed)
Pt was seen in ER yesterday at [facility] they advised pt to be seen by our provider today. Pt is experiencing bleeding that happened after intercourse last night she is stating it is enough blood to fill a pad and she is experiencing lower abdominal cramping. Please advise if pt needs to be seen here or be seen in ER. Thank you

## 2019-03-17 NOTE — Telephone Encounter (Signed)
She had a normal ultrasound showing a viable intrauterine pregnancy today at 2:00 AM, there is nothing we can do except observe the bleeding.  She can follow up sometime later this week with any provider that has an opening

## 2019-03-17 NOTE — Telephone Encounter (Signed)
Pt scheduled 03/20/19 with [facility]

## 2019-03-19 ENCOUNTER — Telehealth: Payer: Self-pay | Admitting: Obstetrics & Gynecology

## 2019-03-19 NOTE — Telephone Encounter (Signed)
Came across pt's NOB and she has the [facility] that is out of network with [place]. Clarise Cruz scheduled and Doroteo Bradford checked it. Reminded both parties that we are out of network with this insurance.  Spoke to pt and she is aware we are out of network and will be higher costs since she has an appt tomorrow but it is to follow up on ob bleeding that she is still having. Advised to keep of course considering the appt type.

## 2019-03-20 ENCOUNTER — Other Ambulatory Visit: Payer: Self-pay | Admitting: Obstetrics & Gynecology

## 2019-03-20 ENCOUNTER — Encounter: Payer: Self-pay | Admitting: Obstetrics & Gynecology

## 2019-03-20 ENCOUNTER — Ambulatory Visit (INDEPENDENT_AMBULATORY_CARE_PROVIDER_SITE_OTHER): Payer: BLUE CROSS/BLUE SHIELD | Admitting: Obstetrics & Gynecology

## 2019-03-20 ENCOUNTER — Other Ambulatory Visit: Payer: Self-pay

## 2019-03-20 ENCOUNTER — Other Ambulatory Visit (INDEPENDENT_AMBULATORY_CARE_PROVIDER_SITE_OTHER): Payer: BLUE CROSS/BLUE SHIELD

## 2019-03-20 VITALS — BP 140/80 | Wt 217.0 lb

## 2019-03-20 DIAGNOSIS — O3481 Maternal care for other abnormalities of pelvic organs, first trimester: Secondary | ICD-10-CM | POA: Diagnosis not present

## 2019-03-20 DIAGNOSIS — D252 Subserosal leiomyoma of uterus: Secondary | ICD-10-CM | POA: Diagnosis not present

## 2019-03-20 DIAGNOSIS — O26851 Spotting complicating pregnancy, first trimester: Secondary | ICD-10-CM

## 2019-03-20 DIAGNOSIS — Z3491 Encounter for supervision of normal pregnancy, unspecified, first trimester: Secondary | ICD-10-CM

## 2019-03-20 DIAGNOSIS — D251 Intramural leiomyoma of uterus: Secondary | ICD-10-CM | POA: Diagnosis not present

## 2019-03-20 DIAGNOSIS — Z3A09 9 weeks gestation of pregnancy: Secondary | ICD-10-CM

## 2019-03-20 DIAGNOSIS — Z1379 Encounter for other screening for genetic and chromosomal anomalies: Secondary | ICD-10-CM

## 2019-03-20 DIAGNOSIS — O209 Hemorrhage in early pregnancy, unspecified: Secondary | ICD-10-CM

## 2019-03-20 LAB — POCT URINALYSIS DIPSTICK OB
Glucose, UA: NEGATIVE
POC,PROTEIN,UA: NEGATIVE

## 2019-03-20 NOTE — Progress Notes (Signed)
  Obstetric Problem Visit   Chief Complaint:  Chief Complaint  Patient presents with  . Routine Prenatal Visit    History of Present Illness: Patient is a 28 y.o. G2P0010 [redacted]w[redacted]d presenting for first trimester bleeding.  The onset of bleeding was Sunday, noted after sex, with some lingering brown like d/c since.  No other problems this pregnancy.  No pain.  PMHx: She  has no past medical history on file. Also,  has a past surgical history that includes ablation., family history includes Cancer in her mother; Other in her father.,  reports that she has never smoked. She has never used smokeless tobacco. She reports current alcohol use. She reports that she does not use drugs.  She currently has no medications in their medication list. Also, is allergic to nutmeg oil (myristica oil) and penicillins.  Review of Systems  All other systems reviewed and are negative.   Objective: Vitals:   03/20/19 0910  BP: 140/80   Physical Exam Constitutional:      General: She is not in acute distress.    Appearance: She is well-developed.  Genitourinary:     Pelvic exam was performed with patient supine.     Vagina and uterus normal.     No vaginal erythema or bleeding.     No cervical motion tenderness, discharge, polyp or nabothian cyst.     Uterus is mobile.     Uterus is not enlarged.     No uterine mass detected.    Uterus is midaxial.     No right or left adnexal mass present.     Right adnexa not tender.     Left adnexa not tender.     Genitourinary Comments: Cx closed, no active bleeding seen   HENT:     Head: Normocephalic and atraumatic.     Nose: Nose normal.  Abdominal:     General: There is no distension.     Palpations: Abdomen is soft.     Tenderness: There is no abdominal tenderness.  Musculoskeletal:        General: Normal range of motion.  Neurological:     Mental Status: She is alert and oriented to person, place, and time.     Cranial Nerves: No cranial nerve  deficit.  Skin:    General: Skin is warm and dry.  Psychiatric:        Attention and Perception: Attention normal.        Mood and Affect: Mood and affect normal.        Speech: Speech normal.        Behavior: Behavior normal.        Thought Content: Thought content normal.        Judgment: Judgment normal.   See [place] report, Viable IUP w FHTs  Assessment: 28 y.o. G2P0010 [redacted]w[redacted]d 1. Encounter for genetic screening for Down Syndrome - Next appt - MaterniT21 PLUS Core+SCA; Future  2. First trimester bleeding Due to sex activity Monitor for s/sx miscarriage; low risk Routine bleeding precautions were discussed with the patient prior the conclusion of today's visit.  Barnett Applebaum, MD, [facility], [facility] 03/20/2019  11:33 AM

## 2019-04-01 ENCOUNTER — Other Ambulatory Visit: Payer: BLUE CROSS/BLUE SHIELD

## 2019-04-01 ENCOUNTER — Encounter: Payer: BLUE CROSS/BLUE SHIELD | Admitting: Obstetrics and Gynecology

## 2019-04-04 ENCOUNTER — Telehealth: Payer: Self-pay

## 2019-04-04 NOTE — Telephone Encounter (Signed)
Called and left voicemail for patient to call back to be worked in

## 2019-04-04 NOTE — Telephone Encounter (Signed)
Pt is [redacted] weeks gestation. She has been leaking clear water, no odor. When she wipes it is light brown. Having light cramping.  Please advise if can be worked in here or needs to go to ER. I think ER, but want MD advise.CB#  [phone]

## 2019-04-04 NOTE — Telephone Encounter (Signed)
Work in per [facility]

## 2019-04-07 NOTE — Telephone Encounter (Signed)
Returned pt's call.  Adv d/c is nl throughout the whole preg and as preg progresses it will probably get heavier.  Cramping normal throughout the whole preg as long as it doesn't double her over or stop her in her tracks. Pt also c/o pressure - adv to let nurse know at appt tomorrow so we can ck for UTI.

## 2019-04-07 NOTE — Telephone Encounter (Signed)
Pt called after hour nurse 1/15 5:57pm, 1/16 2:05pm, 1/16 3:21pm  C/o having vaginal d/c that is clear and watery c brownish in color at times; feels wet all the time and feeling like she is needing to urinate all of the time.  Per JEG okay to be seen today.  [phone]  [place].  No availability today but does have appt tomorrow.

## 2019-04-07 NOTE — Telephone Encounter (Signed)
Patient returning Rita's call.  She is aware no appointments today, has appointment tomorrow but still would like nurse to give her a call back when back from lunch.

## 2019-04-08 ENCOUNTER — Other Ambulatory Visit: Payer: Self-pay

## 2019-04-08 ENCOUNTER — Other Ambulatory Visit: Payer: Self-pay | Admitting: Obstetrics & Gynecology

## 2019-04-08 ENCOUNTER — Ambulatory Visit (INDEPENDENT_AMBULATORY_CARE_PROVIDER_SITE_OTHER): Payer: BC Managed Care – PPO

## 2019-04-08 ENCOUNTER — Encounter: Payer: Self-pay | Admitting: Obstetrics & Gynecology

## 2019-04-08 ENCOUNTER — Ambulatory Visit (INDEPENDENT_AMBULATORY_CARE_PROVIDER_SITE_OTHER): Payer: Self-pay | Admitting: Obstetrics & Gynecology

## 2019-04-08 VITALS — BP 128/88 | Wt 212.0 lb

## 2019-04-08 DIAGNOSIS — Z3A12 12 weeks gestation of pregnancy: Secondary | ICD-10-CM

## 2019-04-08 DIAGNOSIS — Z3480 Encounter for supervision of other normal pregnancy, unspecified trimester: Secondary | ICD-10-CM

## 2019-04-08 DIAGNOSIS — Z3491 Encounter for supervision of normal pregnancy, unspecified, first trimester: Secondary | ICD-10-CM | POA: Diagnosis not present

## 2019-04-08 DIAGNOSIS — O3481 Maternal care for other abnormalities of pelvic organs, first trimester: Secondary | ICD-10-CM

## 2019-04-08 DIAGNOSIS — N8311 Corpus luteum cyst of right ovary: Secondary | ICD-10-CM

## 2019-04-08 DIAGNOSIS — O99210 Obesity complicating pregnancy, unspecified trimester: Secondary | ICD-10-CM

## 2019-04-08 DIAGNOSIS — Z131 Encounter for screening for diabetes mellitus: Secondary | ICD-10-CM

## 2019-04-08 DIAGNOSIS — Z130 Encounter for screening for diseases of the blood and blood-forming organs and certain disorders involving the immune mechanism: Secondary | ICD-10-CM | POA: Diagnosis not present

## 2019-04-08 DIAGNOSIS — Z3481 Encounter for supervision of other normal pregnancy, first trimester: Secondary | ICD-10-CM

## 2019-04-08 DIAGNOSIS — Z1379 Encounter for other screening for genetic and chromosomal anomalies: Secondary | ICD-10-CM

## 2019-04-08 NOTE — Patient Instructions (Signed)

## 2019-04-08 NOTE — Progress Notes (Signed)
  Subjective  Fetal Movement? no Contractions? no Leaking Fluid/ Discharge? Yes, clear to white d/c    No itch or burn or odor Vaginal Bleeding? no  [place] normal today  Objective  BP 128/88   Wt 212 lb (96.2 kg)   LMP 01/14/2019 (Exact Date)   BMI 37.55 kg/m  General: NAD Pumonary: no increased work of breathing Abdomen: gravid, non-tender Extremities: no edema Psychiatric: mood appropriate, affect full  Assessment  29 y.o. G2P0010 at [redacted]w[redacted]d by  10/21/2019, by Last Menstrual Period presenting for routine prenatal visit  Plan   Problem List Items Addressed This Visit      Other   Supervision of other normal pregnancy, antepartum    Other Visit Diagnoses    [redacted] weeks gestation of pregnancy    -  Primary    Review of ULTRASOUND.    I have personally reviewed images and report of recent ultrasound done at [facility].    Plan of management to be discussed with patient.  Labs today Glucola later as she ate sugary breakfast [place] discussed PNV  Barnett Applebaum, MD, [facility], [facility] 04/08/2019  10:12 AM

## 2019-04-08 NOTE — Addendum Note (Signed)
Addended by: Gae Dry on: 04/08/2019 10:26 AM   Modules accepted: Orders

## 2019-04-10 LAB — RPR+RH+ABO+RUB AB+AB SCR+CB...
Antibody Screen: NEGATIVE
HIV Screen 4th Generation wRfx: NONREACTIVE
Hematocrit: 38.2 % (ref 34.0–46.6)
Hemoglobin: 13 g/dL (ref 11.1–15.9)
Hepatitis B Surface Ag: NEGATIVE
MCH: 28.2 pg (ref 26.6–33.0)
MCHC: 34 g/dL (ref 31.5–35.7)
MCV: 83 fL (ref 79–97)
Platelets: 305 10*3/uL (ref 150–450)
RBC: 4.61 x10E6/uL (ref 3.77–5.28)
RDW: 13.5 % (ref 11.7–15.4)
RPR Ser Ql: NONREACTIVE
Rh Factor: POSITIVE
Rubella Antibodies, IGG: 1.79 index (ref >0.99)
Varicella zoster IgG: 2194 index (ref >165)
WBC: 8.4 10*3/uL (ref 3.4–10.8)

## 2019-04-10 LAB — HEMOGLOBINOPATHY EVALUATION
HGB C: 0 %
HGB S: 0 %
HGB VARIANT: 0 %
Hemoglobin A2 Quantitation: 2.1 % (ref 1.8–3.2)
Hemoglobin F Quantitation: 0 % (ref 0.0–2.0)
Hgb A: 97.9 % (ref 96.4–98.8)

## 2019-04-13 LAB — MATERNIT21 PLUS CORE+SCA
Fetal Fraction: 6
Monosomy X (Turner Syndrome): NOT DETECTED
Result (T21): NEGATIVE
Trisomy 13 (Patau syndrome): NEGATIVE
Trisomy 18 (Edwards syndrome): NEGATIVE
Trisomy 21 (Down syndrome): NEGATIVE
XXX (Triple X Syndrome): NOT DETECTED
XXY (Klinefelter Syndrome): NOT DETECTED
XYY (Jacobs Syndrome): NOT DETECTED

## 2019-04-14 ENCOUNTER — Other Ambulatory Visit: Payer: Self-pay

## 2019-04-22 ENCOUNTER — Telehealth: Payer: Self-pay

## 2019-04-22 NOTE — Telephone Encounter (Signed)
Pt calling today c/o some uti symptoms.  Can we get her worked in to see someone this PM or tomorrow please? She is wanting to see a provider and we need to check her urine.

## 2019-04-22 NOTE — Telephone Encounter (Signed)
Called and spoke with patient to schedule for today. Patient is unable to come in today due to work. Patient is schedule for 04/23/19 at 3 pm for a Nurse visit

## 2019-04-23 ENCOUNTER — Ambulatory Visit: Payer: BLUE CROSS/BLUE SHIELD

## 2019-04-24 ENCOUNTER — Telehealth: Payer: Self-pay

## 2019-04-24 NOTE — Telephone Encounter (Signed)
Pt calling to f/u c nurse she talked to last night; was told to come in in the next 24-48hrs.  [phone]  Pt states she had an anger outburst yesterday that lasted about 20 minutes; had H/A afterwards and some cramping.  Adv outburst is probably from hormones - to monitor and let us know if it continues. As far as the H/A goes she can take two e.s. tylenol q6hrs while awake and she is allowed 16oz of caffeine a day including chocolate.  Pt has appt c lab Mon and will also leave ccu to ck for UTI.  Will have her added to nurses' schedule.

## 2019-04-25 ENCOUNTER — Telehealth: Payer: Self-pay

## 2019-04-25 ENCOUNTER — Ambulatory Visit
Admission: EM | Admit: 2019-04-25 | Discharge: 2019-04-25 | Disposition: A | Discharge status: Home / Self Care | Payer: BC Managed Care – PPO | Attending: Family Medicine | Admitting: Family Medicine

## 2019-04-25 ENCOUNTER — Other Ambulatory Visit: Payer: Self-pay

## 2019-04-25 DIAGNOSIS — H66002 Acute suppurative otitis media without spontaneous rupture of ear drum, left ear: Secondary | ICD-10-CM | POA: Diagnosis not present

## 2019-04-25 DIAGNOSIS — H6123 Impacted cerumen, bilateral: Secondary | ICD-10-CM | POA: Diagnosis not present

## 2019-04-25 DIAGNOSIS — R8271 Bacteriuria: Secondary | ICD-10-CM

## 2019-04-25 DIAGNOSIS — H6121 Impacted cerumen, right ear: Secondary | ICD-10-CM

## 2019-04-25 DIAGNOSIS — O99891 Other specified diseases and conditions complicating pregnancy: Secondary | ICD-10-CM | POA: Insufficient documentation

## 2019-04-25 DIAGNOSIS — H60503 Unspecified acute noninfective otitis externa, bilateral: Secondary | ICD-10-CM | POA: Diagnosis not present

## 2019-04-25 DIAGNOSIS — H6122 Impacted cerumen, left ear: Secondary | ICD-10-CM | POA: Diagnosis not present

## 2019-04-25 DIAGNOSIS — O2340 Unspecified infection of urinary tract in pregnancy, unspecified trimester: Secondary | ICD-10-CM | POA: Diagnosis not present

## 2019-04-25 LAB — URINALYSIS, COMPLETE (UACMP) WITH MICROSCOPIC
Bilirubin Urine: NEGATIVE
Glucose, UA: NEGATIVE mg/dL
Hgb urine dipstick: NEGATIVE
Ketones, ur: NEGATIVE mg/dL
Leukocytes,Ua: NEGATIVE
Nitrite: NEGATIVE
Protein, ur: NEGATIVE mg/dL
RBC / HPF: NONE SEEN RBC/hpf (ref 0–5)
Specific Gravity, Urine: 1.02 (ref 1.005–1.030)
pH: 6 (ref 5.0–8.0)

## 2019-04-25 MED ORDER — CEFDINIR 300 MG PO CAPS: 300.0000 mg | ORAL_CAPSULE | Freq: Two times a day (BID) | ORAL | 0 refills | Status: AC

## 2019-04-25 MED ORDER — NEOMYCIN-POLYMYXIN-HC 3.5-10000-1 OT SUSP: 4.0000 [drp] | Freq: Three times a day (TID) | OTIC | 0 refills | Status: DC

## 2019-04-25 NOTE — Telephone Encounter (Signed)
Pt called triage reporting she is going to urgent care, she has a ear infection and a head cold , needs to know what meds she can take, regular Robitussin or mucinex for a cold, Regular sudafed.

## 2019-04-25 NOTE — ED Triage Notes (Signed)
Patient c/o bilateral ear pain and HAs for the past 4 days.  Patient also reports urinary pressure and urinary frequency that started a week ago.  Patient states that she is [redacted] weeks pregnant. Patient denies fevers.

## 2019-04-25 NOTE — Telephone Encounter (Signed)
She can take it from a pregnancy standpoint but some people with a PCN allergy can have an allergy to cephalosporins as well.  I'd say she should contact the urgent care that prescribed her the medicine

## 2019-04-25 NOTE — Telephone Encounter (Signed)
Called and left for patient to call back to be schedule. Patient's insurance isn't in network so the charge for this appointment is 74.00 at the time of visit.

## 2019-04-25 NOTE — Telephone Encounter (Signed)
Pt was seen at the urgent care for a ear infection and the Dr prescribed CEFDINIR pt states she is allergic to penicillin wants to know if she should take this or can you send in something different?

## 2019-04-25 NOTE — Discharge Instructions (Signed)
It was very nice seeing you today in clinic. Thank you for entrusting me with your care.   Keep ears clean and dry. Use medications. May use Tylenol as needed.   Make arrangements to follow up with your regular doctor in 1 week for re-evaluation if not improving. If your symptoms/condition worsens, please seek follow up care either here or in the ER. Please remember, our [facility] providers are "right here with you" when you need [place].   Again, it was my pleasure to take care of you today. Thank you for choosing our clinic. I hope that you start to feel better quickly.   Honor Loh, MSN, APRN, FNP-C, CEN Advanced Practice Provider [facility]

## 2019-04-26 NOTE — ED Provider Notes (Signed)
[place], [place]   Name: Suzanne Guzman DOB: 25-Aug-1990 MRN: [id number] CSN: [id number] PCP: Patient, No Pcp Per  Arrival date and time:  04/25/19 1336  Chief Complaint:  Otalgia (bilateral) and Urinary Frequency   NOTE: Prior to seeing the patient today, I have reviewed the triage nursing documentation and vital signs. Clinical staff has updated patient's PMH/PSHx, current medication list, and drug allergies/intolerances to ensure comprehensive history available to assist in medical decision making.   History:   HPI: Suzanne Guzman is a 29 y.o. female who presents today with complaints of multiple medical complaints as follows:  EARS: Patient with pain in her BILATERAL ears (L>R), minor congestion, and generalized headaches. Pain has been progressive over the course of the 4 days. She denies any associated fevers. Patient has not had any other recent upper respiratory symptoms; no cough, sneezing, or sore throat. She denies forceful nose blowing. Patient has appreciated some mild clear otorrhea. She advises that her ability to hear has acutely changed with the onset of the pain; describes hearing as being somewhat muffled. She reports intermittent tinnitus. Pain increased by lateral rotation of her neck. Patient denies history of recurrent ear infections. She has never had tympanostomy tubes in the past. Patient advising that she has not been swimming in the recent past. Patient denies the use of cotton tip swabs to clean her ears.   URINARY: Patient also with complaints of urinary symptoms over the last week or so. She mainly complains of frequency and a post-void "pressure" sensation. No dysuria or urgency. She has not appreciated any gross hematuria, nor has she noticed her urine being malodorous. Patient denies any associated nausea, vomiting, fever, or chills. She has not experienced any pain in her lower back, flank area, or abdomen. Patient advises that she does not have a past  medical history that is significant for recurrent urinary tract infections. She denies any vaginal pain, bleeding, or discharge. Patient's last menstrual period was 01/14/2019 (exact date). Patient currently [redacted] weeks pregnant; [facility] 10/21/2019. Patient followed by Dr. Kenton Kingfisher (OB/GYN).   No past medical history on file.  Past Surgical History:  Procedure Laterality Date   ablation     heart    Family History  Problem Relation Age of Onset   Cancer Mother    Other Father        MDS    Social History   Tobacco Use   Smoking status: Never Smoker   Smokeless tobacco: Never Used  Substance Use Topics   Alcohol use: Yes   Drug use: Never    Patient Active Problem List   Diagnosis Date Noted   Supervision of other normal pregnancy, antepartum 03/13/2019   Obesity affecting pregnancy, antepartum 03/13/2019    Home Medications:    No outpatient medications have been marked as taking for the 04/25/19 encounter [facility] Encounter).    Allergies:   Nutmeg oil (myristica oil) and Penicillins  Review of Systems (ROS): Review of Systems  Constitutional: Negative for fatigue and fever.  HENT: Positive for congestion, ear discharge, ear pain and tinnitus. Negative for postnasal drip, rhinorrhea, sinus pressure, sinus pain, sneezing and sore throat.   Eyes: Negative for pain, discharge and redness.  Respiratory: Negative for cough, chest tightness and shortness of breath.   Cardiovascular: Negative for chest pain and palpitations.  Gastrointestinal: Negative for abdominal pain, diarrhea, nausea and vomiting.  Genitourinary: Positive for frequency. Negative for dysuria, flank pain, hematuria, pelvic pain, urgency, vaginal bleeding, vaginal discharge and vaginal pain.  Confirmed 14 week IUP; [facility] 10/21/2019  Musculoskeletal: Positive for neck pain. Negative for arthralgias, back pain and myalgias.  Skin: Negative for color change, pallor and rash.  Neurological: Negative for  dizziness, syncope, weakness and headaches.  Hematological: Negative for adenopathy.     Vital Signs: Today's Vitals   04/25/19 1359 04/25/19 1400 04/25/19 1401 04/25/19 1513  BP:   (!) 159/94   Pulse:   82   Resp:   16   Temp:   98.6 F (37 C)   TempSrc:   Oral   SpO2:   100%   Weight:  212 lb (96.2 kg)    Height:  5\' 3"  (1.6 m)    PainSc: 4    4     Physical Exam: Physical Exam  Constitutional: She is oriented to person, place, and time and well-developed, well-nourished, and in no distress.  HENT:  Head: Normocephalic and atraumatic.  Right Ear: There is tenderness. Decreased hearing (muffled) is noted.  Left Ear: There is drainage and tenderness. Decreased hearing (muffled) is noted.  Nose: Rhinorrhea present. No mucosal edema or sinus tenderness.  Mouth/Throat: Uvula is midline, oropharynx is clear and moist and mucous membranes are normal.  BILATERAL ears with EACs totally obscured by cerumen. Unable to visualize TM (pre-procedural).   Eyes: Pupils are equal, round, and reactive to light.  Neck:  Pain in lateral neck (L>R) with rotation.  Cardiovascular: Normal rate, regular rhythm, normal heart sounds and intact distal pulses.  Pulmonary/Chest: Effort normal and breath sounds normal.  Abdominal: Soft. Normal appearance, normal aorta and bowel sounds are normal. There is no abdominal tenderness.  Musculoskeletal:     Cervical back: Normal range of motion and neck supple.  Lymphadenopathy:       Head (right side): Submandibular adenopathy present.       Head (left side): Submandibular adenopathy present.  Neurological: She is alert and oriented to person, place, and time. Gait normal.  Skin: Skin is warm and dry. No rash noted. She is not diaphoretic.  Psychiatric: Mood, memory, affect and judgment normal.  Nursing note and vitals reviewed.   Urgent Care Treatments / Results:   Orders Placed This Encounter  Procedures   Urinalysis, Complete w Microscopic    Ear wax removal   ED EAR CERUMEN REMOVAL    LABS: PLEASE NOTE: all labs that were ordered this encounter are listed, however only abnormal results are displayed. Labs Reviewed  URINALYSIS, COMPLETE (UACMP) WITH MICROSCOPIC - Abnormal; Notable for the following components:      Result Value   Bacteria, UA MANY (*)    All other components within normal limits    EKG: -None  RADIOLOGY: No results found.  PROCEDURES: Ear Cerumen Removal Performed by: Karen Kitchens, NP Authorized by: Karen Kitchens, NP   Consent:    Consent obtained:  Verbal   Consent given by:  Patient   Risks discussed:  Bleeding, infection, pain, dizziness, incomplete removal and TM perforation   Alternatives discussed:  Alternative treatment and referral Procedure details:    Location:  L ear and R ear   Procedure type comment:  Warm water lavage followed by gentle disimpaction attempt with loop currette. Post-procedure details:    Inspection:  Bleeding, macerated skin and TM intact   Hearing quality:  Improved   Patient tolerance of procedure:  Tolerated well, no immediate complications Comments:     Post-disimpaction reveals suppurative effusion on the LEFT causing significant bulging of the TM.  MEDICATIONS RECEIVED THIS VISIT: Medications - No data to display  PERTINENT CLINICAL COURSE NOTES/UPDATES:   Initial Impression / Assessment and Plan / Urgent Care Course:  Pertinent labs & imaging results that were available during my care of the patient were personally reviewed by me and considered in my medical decision making (see lab/imaging section of note for values and interpretations).  Suzanne Guzman is a 29 y.o. female who presents to [facility] today with complaints of Otalgia (bilateral) and Urinary Frequency  Patient is well appearing overall in clinic today. She does not appear to be in any acute distress. Presenting symptoms (see HPI) and exam as documented above. Patient  with recurrent cerumen impactions in her BILATERAL ears. She was seen here in July for the same. Ears disimpacted as per above procedure note. Post-procedural inspection reveals bleeding and tissue maceration BILATERALLY. The LEFT ear has a normal TM, however the RIGHT ear appears to be infected. Discussed avoiding cotton tipped swabs and ears phones. Routine ear hygiene reviewed; recommended Debrox (OTC). Will proceed with treatment for BILATERAL otitis externa, as well as AOME on the LEFT. Allergy list reviewed. She is PCN allergic and [redacted] weeks pregnant. Discussed low cross-reactivity rate of incidence between PCN and 3rd generation cephalosporins. Will cover with a 10 day course of oral cefdinir and a 5 day course of Cortisporin otic gtts. Discussed supportive care measures at home during. She was encouraged to ensure adequate hydration. Patient may use APAP on an as needed basis for discomfort.   Regarding her urinary complaints. This is likely multifactorial related to fluid intake and the physiological changes associated with pregnancy. No bleeding, vaginal pain, or abdominal pain. No NVDF or chills. UA today negative for infection; 0-5 WBC/hpf, 0 RBC/hpf, many bacteria; no nitrites or LE. Sample essentially represents asymptomatic bacteruria. Will defer reflex C&S at this time. Reassurance provided. Encouraged to review further concerns with Dr. Kenton Kingfisher, especially if she develops any pain, bleeding, or fever/chills.   Discussed follow up with primary care physician in 1 week for re-evaluation if not improving. I have reviewed the follow up and strict return precautions for any new or worsening symptoms. Patient is aware of symptoms that would be deemed urgent/emergent, and would thus require further evaluation either here or in the emergency department. At the time of discharge, she verbalized understanding and consent with the discharge plan as it was reviewed with her. All questions were fielded by  provider and/or clinic staff prior to patient discharge.    Final Clinical Impressions / Urgent Care Diagnoses:   Final diagnoses:  Non-recurrent acute suppurative otitis media of left ear without spontaneous rupture of tympanic membrane  Bilateral impacted cerumen  Acute otitis externa of both ears, unspecified type  Asymptomatic bacteriuria during pregnancy in second trimester    New Prescriptions:  [place] Controlled Substance Registry consulted? Not Applicable  Meds ordered this encounter  Medications   cefdinir (OMNICEF) 300 MG capsule    Sig: Take 1 capsule (300 mg total) by mouth 2 (two) times daily for 10 days.    Dispense:  20 capsule    Refill:  0   neomycin-polymyxin-hydrocortisone (CORTISPORIN) 3.5-10000-1 OTIC suspension    Sig: Place 4 drops into both ears 3 (three) times daily. X 5 days    Dispense:  10 mL    Refill:  0    Recommended Follow up Care:  Patient encouraged to follow up with the following provider within the specified time frame, or sooner as dictated  by the severity of her symptoms. As always, she was instructed that for any urgent/emergent care needs, she should seek care either here or in the emergency department for more immediate evaluation.  Follow-up Information    PCP In 1 week.   Why: General reassessment of symptoms if not improving        NOTE: This note was prepared using Lobbyist along with smaller Company secretary. Despite my best ability to proofread, there is the potential that transcriptional errors may still occur from this process, and are completely unintentional.    Karen Kitchens, NP 04/26/19 1709

## 2019-04-28 ENCOUNTER — Telehealth: Payer: Self-pay

## 2019-04-28 ENCOUNTER — Telehealth: Payer: Self-pay | Admitting: Obstetrics & Gynecology

## 2019-04-28 ENCOUNTER — Other Ambulatory Visit: Payer: Self-pay

## 2019-04-28 ENCOUNTER — Ambulatory Visit: Payer: BLUE CROSS/BLUE SHIELD

## 2019-04-28 ENCOUNTER — Other Ambulatory Visit: Payer: Self-pay | Admitting: Obstetrics and Gynecology

## 2019-04-28 ENCOUNTER — Ambulatory Visit (INDEPENDENT_AMBULATORY_CARE_PROVIDER_SITE_OTHER): Payer: Self-pay

## 2019-04-28 DIAGNOSIS — R102 Pelvic and perineal pain: Secondary | ICD-10-CM | POA: Diagnosis not present

## 2019-04-28 DIAGNOSIS — O26899 Other specified pregnancy related conditions, unspecified trimester: Secondary | ICD-10-CM

## 2019-04-28 NOTE — Telephone Encounter (Signed)
Pt called after hour nurse 04/25/19 7:57pm stating she was seen in the Urgent Care; recv'd cefdinir for ear infection; she is allergic to PCN; she is wondering if she can take this; [redacted]w[redacted]d.  After hour nurse called pt back and let her know that she needed to call the Urgent Care and get an alternative med; and to let her know that it was not typical to feel flutters every day until around 18-20 weeks.  This was per AMS.  [phone]  [facility] c update.

## 2019-04-28 NOTE — Telephone Encounter (Signed)
Patient's subscriber ID [id number] . Raven is calling from [place] [facility] confirming patient's active status with [employer]. I explained to Raven that on our end entering patient into their web site patient doesn't have active status. That we would keep checking and count refile insurance once status is active

## 2019-04-29 NOTE — Telephone Encounter (Signed)
Pt came in to give urine sample for culture 04/28/19.

## 2019-04-30 ENCOUNTER — Telehealth: Payer: Self-pay

## 2019-04-30 LAB — URINE CULTURE: Organism ID, Bacteria: NO GROWTH

## 2019-04-30 NOTE — Telephone Encounter (Signed)
Pt calling; knows urine culture results are negative; is curious to know what the pressure she feels before and after she goes to the bathroom could be coming from.  [phone]

## 2019-05-01 ENCOUNTER — Ambulatory Visit: Payer: BLUE CROSS/BLUE SHIELD

## 2019-05-01 ENCOUNTER — Other Ambulatory Visit: Payer: Self-pay

## 2019-05-05 ENCOUNTER — Other Ambulatory Visit: Payer: Self-pay

## 2019-05-05 ENCOUNTER — Telehealth: Payer: Self-pay | Admitting: Obstetrics and Gynecology

## 2019-05-05 ENCOUNTER — Ambulatory Visit (INDEPENDENT_AMBULATORY_CARE_PROVIDER_SITE_OTHER): Payer: BC Managed Care – PPO | Admitting: Obstetrics and Gynecology

## 2019-05-05 VITALS — BP 132/74 | Wt 217.0 lb

## 2019-05-05 DIAGNOSIS — Z3482 Encounter for supervision of other normal pregnancy, second trimester: Secondary | ICD-10-CM

## 2019-05-05 DIAGNOSIS — O99212 Obesity complicating pregnancy, second trimester: Secondary | ICD-10-CM

## 2019-05-05 DIAGNOSIS — Z3480 Encounter for supervision of other normal pregnancy, unspecified trimester: Secondary | ICD-10-CM

## 2019-05-05 DIAGNOSIS — Z3A15 15 weeks gestation of pregnancy: Secondary | ICD-10-CM

## 2019-05-05 DIAGNOSIS — Z363 Encounter for antenatal screening for malformations: Secondary | ICD-10-CM

## 2019-05-05 DIAGNOSIS — O99210 Obesity complicating pregnancy, unspecified trimester: Secondary | ICD-10-CM

## 2019-05-05 NOTE — Telephone Encounter (Signed)
-----   Message from Homero Fellers, MD sent at 05/04/2019 10:16 AM EST ----- This patient reported decreased fetal movements over the weekend. She is only 16 weeks. I asked her to come to the office Monday for a fetal heart rate check rather than the scheduled telephone visit.  Thanks,  Dr. Gilman Schmidt

## 2019-05-05 NOTE — Progress Notes (Signed)
ROB  Decrease fetal movement

## 2019-05-05 NOTE — Telephone Encounter (Signed)
Confirmed with patient in office schedule appointment today

## 2019-05-06 NOTE — Progress Notes (Signed)
    Routine Prenatal Care Visit  Subjective  Suzanne Guzman is a 29 y.o. G2P0010 at [redacted]w[redacted]d being seen today for ongoing prenatal care.  She is currently monitored for the following issues for this low-risk pregnancy and has Supervision of other normal pregnancy, antepartum and Obesity affecting pregnancy, antepartum on their problem list.  ----------------------------------------------------------------------------------- Patient reports no complaints.   Contractions: Not present. Vag. Bleeding: None.  Movement: Present. Denies leaking of fluid.  ----------------------------------------------------------------------------------- The following portions of the patient's history were reviewed and updated as appropriate: allergies, current medications, past family history, past medical history, past social history, past surgical history and problem list. Problem list updated.   Objective  Blood pressure 132/74, weight 217 lb (98.4 kg), last menstrual period 01/14/2019. Pregravid weight 210 lb (95.3 kg) Total Weight Gain 7 lb (3.175 kg)  Body mass index is 38.44 kg/m.  Urinalysis:      Fetal Status: Fetal Heart Rate (bpm): 150   Movement: Present     General:  Alert, oriented and cooperative. Patient is in no acute distress.  Skin: Skin is warm and dry. No rash noted.   Cardiovascular: Normal heart rate noted  Respiratory: Normal respiratory effort, no problems with respiration noted  Abdomen: Soft, gravid, appropriate for gestational age. Pain/Pressure: Absent     Pelvic:  Cervical exam deferred        Extremities: Normal range of motion.     ental Status: Normal mood and affect. Normal behavior. Normal judgment and thought content.     Assessment   29 y.o. G2P0010 at [redacted]w[redacted]d by  10/21/2019, by Last Menstrual Period presenting for routine prenatal visit  Plan   Pregnancy#2 Problems (from 01/14/19 to present)    Problem Noted Resolved   Supervision of other normal pregnancy,  antepartum 03/13/2019 by Rod Can, CNM No   Overview Addendum 05/06/2019  9:48 AM by Malachy Mood, [place] Prenatal Labs  Dating By LMP c/w 6w @ [facility] Blood type: A/Positive/-- (01/19 1046)   Genetic Screen NIPS: normal XY Antibody:Negative (01/19 1046)  Anatomic [place]  Rubella: 1.79 (01/19 1046) Varicella: Immune  GTT Early:               28 wk:  RPR: Non Reactive (01/19 1046)   Rhogam  HBsAg: Negative (01/19 1046)   Vaccines TDAP:                       Flu Shot: HIV: Non Reactive (01/19 1046)   Baby Food Breast                               GBS:   Contraception  Pap: 03/13/19:  CBB     CS/VBAC NA   Support Person Legrand Como          Obesity affecting pregnancy, antepartum 03/13/2019 by Rod Can, CNM No      Gestational age appropriate obstetric precautions including but not limited to vaginal bleeding, contractions, leaking of fluid and fetal movement were reviewed in detail with the patient.    - has not done early 1-hr will obtain at time of anatomy scan  Return in about 4 weeks (around 06/02/2019) for ROB and anatomy scan.  Malachy Mood, MD, [facility], [place]

## 2019-05-19 ENCOUNTER — Telehealth: Payer: Self-pay

## 2019-05-19 ENCOUNTER — Other Ambulatory Visit: Payer: Self-pay | Admitting: Obstetrics and Gynecology

## 2019-05-19 MED ORDER — TERCONAZOLE 0.4 % VA CREA: 1.0000 | TOPICAL_CREAM | Freq: Every day | VAGINAL | 1 refills | Status: DC

## 2019-05-19 NOTE — Telephone Encounter (Signed)
Called in some terazole next Monday is fine and she can see anyone for the pelvic pain

## 2019-05-19 NOTE — Telephone Encounter (Signed)
Called pt to advise, phone is not ringing, called 3 times

## 2019-05-19 NOTE — Telephone Encounter (Signed)
Pt called triage reporting pelvic pain for 2 weeks tylenol helped a little pain is a 6 , also reports a yeast says she has chunky discharge, can you see pt sometime . Please advise Judson Roch says we have nothing until next monday

## 2019-05-20 NOTE — Telephone Encounter (Signed)
Patient reports she called to schedule apt as instructed. Soonest apt she was able to get was for 05/21/2019. She's inquiring if this is soon enough as she was told to make apt for today. [id number]

## 2019-05-20 NOTE — Telephone Encounter (Signed)
Spoke w/patient. Advised of AMS sent in rx for yeast and apt tomorrow is fine.

## 2019-05-21 ENCOUNTER — Other Ambulatory Visit (HOSPITAL_COMMUNITY)
Admission: RE | Admit: 2019-05-21 | Discharge: 2019-05-21 | Disposition: A | Discharge status: Home / Self Care | Payer: BC Managed Care – PPO | Source: Ambulatory Visit | Attending: Obstetrics & Gynecology | Admitting: Obstetrics & Gynecology

## 2019-05-21 ENCOUNTER — Ambulatory Visit (INDEPENDENT_AMBULATORY_CARE_PROVIDER_SITE_OTHER): Payer: BC Managed Care – PPO | Admitting: Obstetrics & Gynecology

## 2019-05-21 ENCOUNTER — Encounter: Payer: Self-pay | Admitting: Obstetrics & Gynecology

## 2019-05-21 ENCOUNTER — Other Ambulatory Visit: Payer: Self-pay

## 2019-05-21 DIAGNOSIS — Z331 Pregnant state, incidental: Secondary | ICD-10-CM

## 2019-05-21 DIAGNOSIS — B3731 Acute candidiasis of vulva and vagina: Secondary | ICD-10-CM

## 2019-05-21 DIAGNOSIS — B373 Candidiasis of vulva and vagina: Secondary | ICD-10-CM | POA: Insufficient documentation

## 2019-05-21 DIAGNOSIS — H6692 Otitis media, unspecified, left ear: Secondary | ICD-10-CM

## 2019-05-21 DIAGNOSIS — S334XXA Traumatic rupture of symphysis pubis, initial encounter: Secondary | ICD-10-CM | POA: Diagnosis not present

## 2019-05-21 NOTE — Progress Notes (Signed)
Gynecology Pelvic Pain Evaluation   Chief Complaint  Patient presents with  . Pelvic Pain   History of Present Illness:   Patient is a 29 y.o. G2P0010 who LMP was Patient's last menstrual period was 01/14/2019 (exact date)., presents today for a problem visit.  She is [redacted] weeks pregnant, but has other complaints today.   She complains of discharge, pain and recent ear infection dx/not treated..   1. Her pelvic pain is localized to the suprapubic area, described as stabbing and aching, began 2 weeks ago but worse these past 3 days and its severity is described as moderate. The pain radiates to the  Non-radiating. She has these associated symptoms which include urinary frequency (but also is pregnant); she denies bleeding. Patient has these modifiers which include relaxation that make it better and activity that make it worse.  2. Vaginal discharge w itching for 3 days, described as curd like and white, not taking anything for it yet (Rx called in yesterday but not picked up yet).  3. Ear infection on left dx by urgent care center, also they cleaned out her ears due to wax and build up.  Feels better.  Did not get ABX as she has PCM allergy and it had potential for reactivity and she did not want to take it  PMHx: She  has no past medical history on file. Also,  has a past surgical history that includes ablation., family history includes Cancer in her mother; Other in her father.,  reports that she has never smoked. She has never used smokeless tobacco. She reports current alcohol use. She reports that she does not use drugs.  She has a current medication list which includes the following prescription(s): neomycin-polymyxin-hydrocortisone and terconazole. Also, is allergic to nutmeg oil (myristica oil) and penicillins.  Review of Systems  Constitutional: Negative for chills, fever and malaise/fatigue.  HENT: Negative for congestion, sinus pain and sore throat.   Eyes: Negative for blurred  vision and pain.  Respiratory: Negative for cough and wheezing.   Cardiovascular: Negative for chest pain and leg swelling.  Gastrointestinal: Positive for abdominal pain. Negative for constipation, diarrhea, heartburn, nausea and vomiting.  Genitourinary: Positive for frequency. Negative for dysuria, hematuria and urgency.  Musculoskeletal: Negative for back pain, joint pain, myalgias and neck pain.  Skin: Negative for itching and rash.  Neurological: Negative for dizziness, tremors and weakness.  Endo/Heme/Allergies: Does not bruise/bleed easily.  Psychiatric/Behavioral: Negative for depression. The patient is not nervous/anxious and does not have insomnia.     Objective: BP 120/80   Wt 215 lb (97.5 kg)   LMP 01/14/2019 (Exact Date)   BMI 38.09 kg/m  Physical Exam Constitutional:      General: She is not in acute distress.    Appearance: She is well-developed.  Genitourinary:     Pelvic exam was performed with patient supine.     Urethra, bladder and uterus normal.     Vaginal discharge present.     No vaginal erythema or bleeding.     No cervical motion tenderness, discharge, polyp or nabothian cyst.     Uterus is mobile.     Uterus is not enlarged.     No uterine mass detected.    Uterus is midaxial.     No right or left adnexal mass present.     Right adnexa not tender.     Left adnexa not tender.     Genitourinary Comments: Cx closed, thivk  HENT:  Head: Normocephalic and atraumatic.     Nose: Nose normal.  Abdominal:     General: Bowel sounds are normal. There is no distension.     Palpations: Abdomen is soft.     Tenderness: There is abdominal tenderness in the suprapubic area. There is no guarding or rebound. Negative signs include Murphy's sign and McBurney's sign.     Hernia: No hernia is present.     Comments: FHT 140s  Musculoskeletal:        General: Normal range of motion.  Neurological:     Mental Status: She is alert and oriented to person, place,  and time.     Cranial Nerves: No cranial nerve deficit.  Skin:    General: Skin is warm and dry.  Psychiatric:        Attention and Perception: Attention normal.        Mood and Affect: Mood and affect normal.        Speech: Speech normal.        Behavior: Behavior normal.        Thought Content: Thought content normal.        Judgment: Judgment normal.     Female chaperone present for pelvic portion of the physical exam  Assessment: 29 y.o. G2P0010 with: Problem List Items Addressed This Visit    Symphysis pubis disruption, initial encounter    -  Primary    Heat, Tylenol    Pregnancy associated sx, discussed   Left otitis media, unspecified otitis media type    May improve without ABX after cleaning of ear, monitor to see    Consider ABX if persists w pain.  (ZPack)   Candida vaginitis        Pt to get Rx for Terazole and take to completion    A total of 20 minutes were spent face-to-face with the patient as well as preparation, review, communication, and documentation during this encounter.   Barnett Applebaum, MD, [facility], [facility] 05/21/2019  2:00 PM

## 2019-05-21 NOTE — Addendum Note (Signed)
Addended by: Gae Dry on: 05/21/2019 02:20 PM   Modules accepted: Orders

## 2019-05-23 LAB — CERVICOVAGINAL ANCILLARY ONLY
Bacterial Vaginitis (gardnerella): NEGATIVE
Candida Glabrata: NEGATIVE
Candida Vaginitis: NEGATIVE
Comment: NEGATIVE
Comment: NEGATIVE
Comment: NEGATIVE
Comment: NEGATIVE
Trichomonas: NEGATIVE

## 2019-06-02 ENCOUNTER — Other Ambulatory Visit: Payer: BC Managed Care – PPO

## 2019-06-02 ENCOUNTER — Ambulatory Visit (INDEPENDENT_AMBULATORY_CARE_PROVIDER_SITE_OTHER): Payer: BC Managed Care – PPO

## 2019-06-02 ENCOUNTER — Other Ambulatory Visit: Payer: Self-pay

## 2019-06-02 ENCOUNTER — Ambulatory Visit (INDEPENDENT_AMBULATORY_CARE_PROVIDER_SITE_OTHER): Payer: BC Managed Care – PPO | Admitting: Obstetrics & Gynecology

## 2019-06-02 ENCOUNTER — Encounter: Payer: Self-pay | Admitting: Obstetrics & Gynecology

## 2019-06-02 VITALS — BP 130/80 | Wt 216.0 lb

## 2019-06-02 DIAGNOSIS — Z363 Encounter for antenatal screening for malformations: Secondary | ICD-10-CM | POA: Diagnosis not present

## 2019-06-02 DIAGNOSIS — Z3482 Encounter for supervision of other normal pregnancy, second trimester: Secondary | ICD-10-CM

## 2019-06-02 DIAGNOSIS — Z3689 Encounter for other specified antenatal screening: Secondary | ICD-10-CM

## 2019-06-02 DIAGNOSIS — Z3480 Encounter for supervision of other normal pregnancy, unspecified trimester: Secondary | ICD-10-CM

## 2019-06-02 DIAGNOSIS — O99210 Obesity complicating pregnancy, unspecified trimester: Secondary | ICD-10-CM | POA: Diagnosis not present

## 2019-06-02 DIAGNOSIS — Z3A19 19 weeks gestation of pregnancy: Secondary | ICD-10-CM

## 2019-06-02 DIAGNOSIS — Z3A20 20 weeks gestation of pregnancy: Secondary | ICD-10-CM

## 2019-06-02 DIAGNOSIS — Z131 Encounter for screening for diabetes mellitus: Secondary | ICD-10-CM | POA: Diagnosis not present

## 2019-06-02 DIAGNOSIS — O99212 Obesity complicating pregnancy, second trimester: Secondary | ICD-10-CM | POA: Diagnosis not present

## 2019-06-02 LAB — POCT URINALYSIS DIPSTICK OB
Glucose, UA: NEGATIVE
POC,PROTEIN,UA: NEGATIVE

## 2019-06-02 NOTE — Progress Notes (Signed)
  Subjective  Fetal Movement? yes Contractions? no Leaking Fluid? no Vaginal Bleeding? no  Objective  BP 130/80   Wt 216 lb (98 kg)   LMP 01/14/2019 (Exact Date)   BMI 38.26 kg/m  General: NAD Pumonary: no increased work of breathing Abdomen: gravid, non-tender Extremities: no edema Psychiatric: mood appropriate, affect full  Assessment  29 y.o. G2P0010 at 102w6d by  10/21/2019, by Last Menstrual Period presenting for routine prenatal visit  Plan   Problem List Items Addressed This Visit      Other   Supervision of other normal pregnancy, antepartum    Other Visit Diagnoses    [redacted] weeks gestation of pregnancy    -  Primary   Relevant Orders   POC Urinalysis Dipstick OB (Completed)   Screening, antenatal, for fetal anatomic survey       Relevant Orders   US OB Follow Up      Pregnancy#2 Problems (from 01/14/19 to present)    Problem Noted Resolved   Supervision of other normal pregnancy, antepartum 03/13/2019 by Rod Can, CNM No   Overview Addendum 05/06/2019  9:48 AM by Malachy Mood, [place] Prenatal Labs  Dating By LMP c/w 6w @ [facility] Blood type: A/Positive/-- (01/19 1046)   Genetic Screen NIPS: normal XY Antibody:Negative (01/19 1046)  Anatomic [place]  Rubella: 1.79 (01/19 1046) Varicella: Immune  GTT Early:               28 wk:  RPR: Non Reactive (01/19 1046)   Rhogam  HBsAg: Negative (01/19 1046)   Vaccines TDAP:                       Flu Shot: HIV: Non Reactive (01/19 1046)   Baby Food Breast                               GBS:   Contraception  Pap: 03/13/19:  CBB     CS/VBAC NA   Support Person Legrand Como          Obesity affecting pregnancy, antepartum 03/13/2019 by Rod Can, CNM No     Review of ULTRASOUND. I have personally reviewed images and report of recent ultrasound done at [facility]. There is a singleton gestation with subjectively normal amniotic fluid volume. The fetal biometry correlates with established dating. Detailed  evaluation of the fetal anatomy was performed.The fetal anatomical survey appears within normal limits within the resolution of ultrasound as described above.  It must be noted that a normal ultrasound is unable to rule out fetal aneuploidy.   F/u US planned nv  PNV Glucola today  Barnett Applebaum, MD, [facility], [facility] 06/02/2019  10:56 AM

## 2019-06-02 NOTE — Patient Instructions (Signed)
Prenatal Ultrasound A prenatal ultrasound exam, also called a sonogram, is an imaging test that allows your health care provider to see your baby and placenta in the uterus. This is a safe and painless test that does not expose you or your baby to any X-rays, needles, or medicines. Prenatal ultrasounds are done using a handheld plastic device (transducer) that sends out sound waves (ultrasound). The sound waves reflect off your baby's bones and other tissues to create moving images on a computer screen. There are two types of prenatal ultrasound:  Transabdominal ultrasound. During this test, a transducer is placed on your belly and moved around. A routine transabdominal ultrasound is usually done between weeks 18 and 22 of pregnancy (standard ultrasound). It may also be done between weeks 13 and 14.  Transvaginal ultrasound. During this test, a transducer that is shaped like a wand is placed inside your vagina. This type of ultrasound is usually done during early pregnancy. Prenatal ultrasounds may be used to check:  How far along your pregnancy is (stage).  Your baby's development (gestational age).  The location and condition of the organ that supplies your baby with nourishment and oxygen (placenta).  Your baby's heart rate, position, and movements.  Your baby's approximate size and weight.  The amount of fluid surrounding your baby (amniotic fluid).  If you are carrying more than one baby.  Your baby's sex (if your baby is in a position that allows the sex organs to be seen, and if you choose to learn the sex at this time).  If there are any possible problems that require more testing, such as genetic problems.  If your pregnancy is forming outside your uterus (ectopic pregnancy). You may have other ultrasounds as needed at any point during your pregnancy. If your health care provider suspects a problem, you may also have a more detailed type of transabdominal ultrasound (advanced  ultrasound). What are the risks? Generally, this is a safe test. There are no known risks for you or your baby from a prenatal ultrasound. What happens before the test?  Before a transabdominal ultrasound, you may be asked to drink fluid 2 hours before the exam and avoid emptying your bladder. A full bladder helps the images show up more clearly.  Before a transvaginal ultrasound, you may be asked to empty your bladder before the exam.  Wear loose, comfortable clothing so it is easy to undress or expose your lower belly for the exam. What happens during the test? If you are having a transabdominal ultrasound:  You will lie on an exam table.  Your belly will be exposed.  Gel will be rubbed over your belly.  The transducer will be pressed on your belly and moved back and forth, through the gel. You may feel slight pressure, but there should not be any pain.  You may be asked to change your position.  You may hear sounds of blood flow and your baby's heartbeat. You may be able to see images of your baby on the computer screen. Your health care provider may measure your baby's head and other body parts, looking for normal development.  After the exam, the gel will be cleaned off, and you can replace your clothing. You will be able to empty your bladder after the exam is done. If you are having a transvaginal ultrasound:  You will change into a hospital gown or undress from the waist down and cover yourself with a paper sheet.  You will lie down on  an exam table with your feet in footrests (stirrups).  The transducer will be covered with a protective cover and lubricated.  The transducer will be inserted into your vagina.  You may hear sounds of blood flow and your baby's heartbeat. You may be able to see images of your baby on the computer screen.  After the exam, the transducer will be removed, and you can put your clothes back on. What can I expect after the test?  You can  drive yourself home and return to all your normal activities.  A health care provider trained in interpreting ultrasounds will review the images taken during your exam and send a report to your health care provider.  It is up to you to get your test results. Ask your health care provider, or the department that is doing the test, when your results will be ready. Questions to ask your health care provider  Why am I having this prenatal ultrasound?  What information will this exam provide?  How much does this exam cost? What costs will my insurance cover?  Can my partner or support person be with me during the exam?  When can I expect to get the results? Summary  A prenatal ultrasound is a safe and painless imaging exam that gives information about your pregnancy and your developing baby.  Transvaginal ultrasound exams are often done in early pregnancy. Standard transabdominal ultrasounds are typically done between 18 and 22 weeks of pregnancy. You may have other prenatal ultrasounds as needed.  This exam has no risks for you or your baby. After the exam, you can go home and return to all your usual activities. This information is not intended to replace advice given to you by your health care provider. Make sure you discuss any questions you have with your health care provider. Document Revised: 06/28/2018 Document Reviewed: 05/09/2017 Elsevier Patient Education  2020 [employer].

## 2019-06-03 LAB — GLUCOSE, 1 HOUR GESTATIONAL: Gestational Diabetes Screen: 116 mg/dL (ref 65–139)

## 2019-06-05 ENCOUNTER — Telehealth: Payer: Self-pay | Admitting: Obstetrics & Gynecology

## 2019-06-05 NOTE — Telephone Encounter (Signed)
Patient is calling due to missed call from this morning please advise

## 2019-06-16 ENCOUNTER — Telehealth: Payer: Self-pay

## 2019-06-16 NOTE — Telephone Encounter (Signed)
Pt called after hour nurse c/o 22wks; having occasional lower abd pain; more noticeable if laying on left side; a pulsing sensation.  Call pt; states she is okay; not in pain; difficult to explain other that a pulsing sensation.  Pt states she is fine.

## 2019-06-18 ENCOUNTER — Telehealth: Payer: Self-pay

## 2019-06-18 NOTE — Telephone Encounter (Signed)
Pt left msg on triage line saying she is swelling in hands/wrists/fingers and that shoes are starting to not fit. Denies blurred vision or headaches. Advised to keep an eye out for those symptoms and follow up with [place]. She also mentioned she is starting to feel pubic bone pain when walking? Its not all the time. Shes been doing warm compresses.

## 2019-06-19 ENCOUNTER — Other Ambulatory Visit: Payer: Self-pay

## 2019-06-19 ENCOUNTER — Telehealth: Payer: Self-pay

## 2019-06-19 ENCOUNTER — Ambulatory Visit (INDEPENDENT_AMBULATORY_CARE_PROVIDER_SITE_OTHER): Payer: BC Managed Care – PPO | Admitting: Certified Nurse Midwife

## 2019-06-19 VITALS — BP 130/84 | Temp 98.0°F | Wt 218.0 lb

## 2019-06-19 DIAGNOSIS — W19XXXA Unspecified fall, initial encounter: Secondary | ICD-10-CM

## 2019-06-19 DIAGNOSIS — R102 Pelvic and perineal pain: Secondary | ICD-10-CM

## 2019-06-19 DIAGNOSIS — W010XXA Fall on same level from slipping, tripping and stumbling without subsequent striking against object, initial encounter: Secondary | ICD-10-CM | POA: Diagnosis not present

## 2019-06-19 DIAGNOSIS — Y92009 Unspecified place in unspecified non-institutional (private) residence as the place of occurrence of the external cause: Secondary | ICD-10-CM

## 2019-06-19 DIAGNOSIS — S8001XA Contusion of right knee, initial encounter: Secondary | ICD-10-CM

## 2019-06-19 DIAGNOSIS — Z331 Pregnant state, incidental: Secondary | ICD-10-CM

## 2019-06-19 DIAGNOSIS — Z3A22 22 weeks gestation of pregnancy: Secondary | ICD-10-CM

## 2019-06-19 LAB — POCT URINALYSIS DIPSTICK OB: Glucose, UA: NEGATIVE

## 2019-06-19 NOTE — Telephone Encounter (Signed)
Can you please get this scheduled? This afternoon please per CLG

## 2019-06-19 NOTE — Telephone Encounter (Signed)
HAve her come in to listen to baby's heart beat

## 2019-06-19 NOTE — Telephone Encounter (Signed)
Called and spoke with patient to schedule. Patient is going to call back to be schedule for work in with [place].

## 2019-06-19 NOTE — Progress Notes (Signed)
C/o pt fell poss to side, hit right knee first then hip, no bleeding or leaking, good fetal movement.rj

## 2019-06-19 NOTE — Telephone Encounter (Signed)
Pt called triage reporting she fell landed on knees then rolled on her  side , she thinks felt the baby she's just scared. No leaking of fluid, Please advise on what to do next,,,

## 2019-06-19 NOTE — Telephone Encounter (Signed)
Patient is schedule for 06/19/19 at 2:30 with CLG

## 2019-06-20 NOTE — Progress Notes (Signed)
Work in [employer] at 22wk2d:Tripped and fell on her right hip and knee this morning around 1030. No vaginal bleeding. Some discomfort in her right groin area with some position changes since then. +FM  Some bruising noted below right knee. Fetal heart tones WNL On bedside ultrasound: Baby moving well. Placenta fundal and appears normal.  IUP at 22wk2d s/p fall No evidence of fetal compromise Right round ligament discomfort  RTO as scheduled and prn problems Tylenol for aches and pains  Dalia Heading, CNM

## 2019-06-30 ENCOUNTER — Other Ambulatory Visit: Payer: Self-pay | Admitting: Obstetrics & Gynecology

## 2019-06-30 ENCOUNTER — Ambulatory Visit (INDEPENDENT_AMBULATORY_CARE_PROVIDER_SITE_OTHER): Payer: BC Managed Care – PPO | Admitting: Obstetrics & Gynecology

## 2019-06-30 ENCOUNTER — Other Ambulatory Visit (INDEPENDENT_AMBULATORY_CARE_PROVIDER_SITE_OTHER): Payer: BC Managed Care – PPO

## 2019-06-30 ENCOUNTER — Encounter: Payer: Self-pay | Admitting: Obstetrics & Gynecology

## 2019-06-30 ENCOUNTER — Other Ambulatory Visit: Payer: Self-pay

## 2019-06-30 VITALS — BP 120/80 | Wt 218.0 lb

## 2019-06-30 DIAGNOSIS — Z3A23 23 weeks gestation of pregnancy: Secondary | ICD-10-CM

## 2019-06-30 DIAGNOSIS — Z0489 Encounter for examination and observation for other specified reasons: Secondary | ICD-10-CM

## 2019-06-30 DIAGNOSIS — IMO0002 Reserved for concepts with insufficient information to code with codable children: Secondary | ICD-10-CM

## 2019-06-30 DIAGNOSIS — Z131 Encounter for screening for diabetes mellitus: Secondary | ICD-10-CM

## 2019-06-30 DIAGNOSIS — Z362 Encounter for other antenatal screening follow-up: Secondary | ICD-10-CM | POA: Diagnosis not present

## 2019-06-30 DIAGNOSIS — Z3482 Encounter for supervision of other normal pregnancy, second trimester: Secondary | ICD-10-CM

## 2019-06-30 NOTE — Progress Notes (Signed)
  Subjective  Fetal Movement? yes Contractions? no Leaking Fluid? no Vaginal Bleeding? no  Objective  BP 120/80   Wt 218 lb (98.9 kg)   LMP 01/14/2019 (Exact Date)   BMI 38.62 kg/m  General: NAD Pumonary: no increased work of breathing Abdomen: gravid, non-tender Extremities: no edema Psychiatric: mood appropriate, affect full  Assessment  29 y.o. G2P0010 at [redacted]w[redacted]d by  10/21/2019, by Last Menstrual Period presenting for routine prenatal visit  Plan   Problem List Items Addressed This Visit      Other   Supervision of other normal pregnancy, antepartum    Other Visit Diagnoses    [redacted] weeks gestation of pregnancy    -  Primary   Screening for diabetes mellitus       Relevant Orders   28 Week RH+Panel      Pregnancy#2 Problems (from 01/14/19 to present)    Problem Noted Resolved   Supervision of other normal pregnancy, antepartum 03/13/2019 by Rod Can, CNM No   Overview Addendum 05/06/2019  9:48 AM by Malachy Mood, [place] Prenatal Labs  Dating By LMP c/w 6w @ [facility] Blood type: A/Positive/-- (01/19 1046)   Genetic Screen NIPS: normal XY Antibody:Negative (01/19 1046)  Anatomic [place]  Rubella: 1.79 (01/19 1046) Varicella: Immune  GTT Early:               28 wk:  RPR: Non Reactive (01/19 1046)   Rhogam  HBsAg: Negative (01/19 1046)   Vaccines TDAP:                       Flu Shot: HIV: Non Reactive (01/19 1046)   Baby Food Breast                               GBS:   Contraception  Pap: 03/13/19:  CBB     CS/VBAC NA   Support Person Legrand Como          Obesity affecting pregnancy, antepartum 03/13/2019 by Rod Can, CNM No     Review of ULTRASOUND. I have personally reviewed images and report of recent ultrasound done at [facility]. There is a singleton gestation with subjectively normal amniotic fluid volume. The fetal biometry correlates with established dating. Detailed evaluation of the fetal anatomy was performed.The fetal anatomical survey  appears within normal limits within the resolution of ultrasound as described above.  It must be noted that a normal ultrasound is unable to rule out fetal aneuploidy.    PNV, [place], third trimester expectations, labor planning, epidural planning, breast feeding, circumcision discussed  Barnett Applebaum, MD, [facility], [facility] 06/30/2019  10:50 AM

## 2019-06-30 NOTE — Patient Instructions (Signed)

## 2019-07-04 ENCOUNTER — Telehealth: Payer: Self-pay

## 2019-07-04 NOTE — Telephone Encounter (Signed)
Pt called triage last night reporting pain on left side. Pt report she is  feeling better today,

## 2019-07-06 ENCOUNTER — Encounter: Payer: Self-pay | Admitting: Obstetrics and Gynecology

## 2019-07-06 ENCOUNTER — Observation Stay
Admission: EM | Admit: 2019-07-06 | Discharge: 2019-07-06 | Disposition: A | Discharge status: Home / Self Care | Payer: BC Managed Care – PPO | Attending: Obstetrics and Gynecology | Admitting: Obstetrics and Gynecology

## 2019-07-06 DIAGNOSIS — O26892 Other specified pregnancy related conditions, second trimester: Secondary | ICD-10-CM | POA: Diagnosis not present

## 2019-07-06 DIAGNOSIS — K219 Gastro-esophageal reflux disease without esophagitis: Secondary | ICD-10-CM

## 2019-07-06 DIAGNOSIS — Z0371 Encounter for suspected problem with amniotic cavity and membrane ruled out: Secondary | ICD-10-CM | POA: Diagnosis not present

## 2019-07-06 DIAGNOSIS — Z3A24 24 weeks gestation of pregnancy: Secondary | ICD-10-CM

## 2019-07-06 DIAGNOSIS — Z91018 Allergy to other foods: Secondary | ICD-10-CM | POA: Diagnosis not present

## 2019-07-06 DIAGNOSIS — Z87892 Personal history of anaphylaxis: Secondary | ICD-10-CM | POA: Diagnosis not present

## 2019-07-06 DIAGNOSIS — Z880 Allergy status to penicillin: Secondary | ICD-10-CM | POA: Insufficient documentation

## 2019-07-06 LAB — RUPTURE OF MEMBRANE (ROM)PLUS: Rom Plus: NEGATIVE

## 2019-07-06 MED ORDER — OMEPRAZOLE 20 MG PO CPDR: 20.0000 mg | DELAYED_RELEASE_CAPSULE | Freq: Every day | ORAL | 11 refills | Status: DC

## 2019-07-06 MED ORDER — ACETAMINOPHEN 325 MG PO TABS: 650.0000 mg | ORAL_TABLET | ORAL | Status: DC | PRN

## 2019-07-06 NOTE — H&P (Signed)
Obstetric H&P   Chief Complaint: Leaking fluid  Prenatal Care Provider: [facility]  History of Present Illness: 29 y.o. G2P0010 [redacted]w[redacted]d by 10/21/2019, by Last Menstrual Period presenting to L&D with possible LOF.  Patient noted some fluid initially yesterday following a shower.  Ttoday noted some more fluid after going to bathroom.  +FM, no VB, no contractions.  Her pregnancy has been uncomplicated to date.    She does report some epigastric discomfort and worsening GERD symptoms.  She has a history of Wolff-Parkinson white s/p cardiac ablation in 2007.  No chest pain.    Pregravid weight 95.3 kg Total Weight Gain 3.629 kg  Pregnancy#2 Problems (from 01/14/19 to present)    Problem Noted Resolved   Supervision of other normal pregnancy, antepartum 03/13/2019 by Rod Can, CNM No   Overview Addendum 05/06/2019  9:48 AM by Malachy Mood, MD    [facility] Prenatal Labs  Dating By LMP c/w 6w @ [facility] Blood type: A/Positive/-- (01/19 1046)   Genetic Screen NIPS: normal XY Antibody:Negative (01/19 1046)  Anatomic [place] Normal female  Rubella: 1.79 (01/19 1046) Varicella: Immune  GTT Early: 116              28 wk:  RPR: Non Reactive (01/19 1046)   Rhogam N/A HBsAg: Negative (01/19 1046)   Vaccines TDAP:                       Flu Shot: HIV: Non Reactive (01/19 1046)   Baby Food Breast                               GBS:   Contraception  Pap: 03/13/19:  CBB   Hgb AA  CS/VBAC NA   Support Person Legrand Como          Obesity affecting pregnancy, antepartum 03/13/2019 by Rod Can, CNM No       Review of Systems: 10 point review of systems negative unless otherwise noted in HPI  Past Medical History: Patient Active Problem List   Diagnosis Date Noted  . Labor and delivery indication for care or intervention 07/06/2019  . Supervision of other normal pregnancy, antepartum 03/13/2019    [facility]inic [facility] Prenatal Labs  Dating By LMP c/w 6w @ [facility] Blood type: A/Positive/-- (01/19 1046)     Genetic Screen NIPS: normal XY Antibody:Negative (01/19 1046)  Anatomic [place]  Rubella: 1.79 (01/19 1046) Varicella: Immune  GTT Early:               28 wk:  RPR: Non Reactive (01/19 1046)   Rhogam  HBsAg: Negative (01/19 1046)   Vaccines TDAP:                       Flu Shot: HIV: Non Reactive (01/19 1046)   Baby Food Breast                               GBS:   Contraception  Pap: 03/13/19:  CBB     CS/VBAC NA   Support Person Legrand Como       . Obesity affecting pregnancy, antepartum 03/13/2019    Past Surgical History: Past Surgical History:  Procedure Laterality Date  . ablation     heart    Past Obstetric History: # 1 - Date: 2016, Sex: None, Weight: None, GA: None, Delivery:  None, Apgar1: None, Apgar5: None, Living: None, Birth Comments: None  # 2 - Date: None, Sex: None, Weight: None, GA: None, Delivery: None, Apgar1: None, Apgar5: None, Living: None, Birth Comments: None   Past Gynecologic History:  Family History: Family History  Problem Relation Age of Onset  . Cancer Mother   . Other Father        MDS    Social History: Social History   Socioeconomic History  . Marital status: Single    Spouse name: Not on file  . Number of children: Not on file  . Years of education: Not on file  . Highest education level: Not on file  Occupational History  . Not on file  Tobacco Use  . Smoking status: Never Smoker  . Smokeless tobacco: Never Used  Substance and Sexual Activity  . Alcohol use: Yes  . Drug use: Never  . Sexual activity: Not on file  Other Topics Concern  . Not on file  Social History Narrative  . Not on file   Social Determinants of Health   Financial Resource Strain:   . Difficulty of Paying Living Expenses:   Food Insecurity:   . Worried About Charity fundraiser in the Last Year:   . Arboriculturist in the Last Year:   Transportation Needs:   . Film/video editor (Medical):   Marland Kitchen Lack of Transportation (Non-Medical):   Physical  Activity:   . Days of Exercise per Week:   . Minutes of Exercise per Session:   Stress:   . Feeling of Stress :   Social Connections:   . Frequency of Communication with Friends and Family:   . Frequency of Social Gatherings with Friends and Family:   . Attends Religious Services:   . Active Member of Clubs or Organizations:   . Attends Archivist Meetings:   Marland Kitchen Marital Status:   Intimate Partner Violence:   . Fear of Current or Ex-Partner:   . Emotionally Abused:   Marland Kitchen Physically Abused:   . Sexually Abused:     Medications: Prior to Admission medications   Medication Sig Start Date End Date Taking? Authorizing Provider  Prenatal Vit-Fe Fumarate-FA (MULTIVITAMIN-PRENATAL) 27-0.8 MG TABS tablet Take 1 tablet by mouth daily at 12 noon.   Yes [provider]    Allergies: Allergies  Allergen Reactions  . Nutmeg Oil (Myristica Oil) Anaphylaxis    Allergy to NUTS  . Penicillins Anaphylaxis    Physical Exam: Vitals: Last menstrual period 01/14/2019.  Urine Dip Protein: N/A  FHT: 150, moderate, 10x10 accels, no decels Toco: none  General: NAD HEENT: normocephalic, anicteric Pulmonary: No increased work of breathing Abdomen: Gravid, non-tender Genitourinary: cervix closed, no pooling, no ferning, negative nitrazine Extremities: no edema, erythema, or tenderness Neurologic: Grossly intact Psychiatric: mood appropriate, affect full  Labs: No results found for this or any previous visit (from the past 24 hour(s)).  Assessment: 29 y.o. G2P0010 [redacted]w[redacted]d by 10/21/2019, by Last Menstrual Period presenting with possible PPROM  Plan: 1) PPROM - initial testing negative ferning/nitrazing/and pooling.  Will check ROM plus to confirm  2) Fetus - cat I tracing appropriate for gestational age  60) PNL - Blood type A/Positive/-- (01/19 1046) / Anti-bodyscreen Negative (01/19 1046) / Rubella 1.79 (01/19 1046) / Varicella Immune / RPR Non Reactive (01/19 1046) / HBsAg  Negative (01/19 1046) / HIV Non Reactive (01/19 1046) / 1-hr OGTT not yet collected / GBS unknown  4) Immunization History -  There is no immunization history on file for this patient.  5) Worsening GERD - currently only on prn TUMS, will add proton pump inhibitor  6)Disposition -  Pending results of ROM plus   Malachy Mood, MD, [place], [facility] 07/06/2019, 11:03 AM

## 2019-07-06 NOTE — Discharge Summary (Signed)
Physician Final Progress Note  Patient ID: Suzanne Guzman MRN: [id number] DOB/AGE: May 14, 1990 29 y.o.  Admit date: 07/06/2019 Admitting provider: Malachy Mood, MD Discharge date: 07/06/2019   Admission Diagnoses: Leaking fluid  Discharge Diagnoses:  Active Problems:   Labor and delivery indication for care or intervention   29 y.o. G2P0010 at [redacted]w[redacted]d presenting with concern for preterm premature rupture of membranes.  Evaluation was negative for ROM including nitrazine, ferning, pooling, and ROMplus.  Patient with reassuring fetal monitoring throughout admission.  Did have mild range BP on initial presentation but resolved and relatively anxious initially.   Will start on omeprazole for GERD symptoms arrange BP check later this week in clinic  Last menstrual period 01/14/2019.   Consults: None  Significant Findings/ Diagnostic Studies:  Results for orders placed or performed during the hospital encounter of 07/06/19 (from the past 24 hour(s))  ROM Plus ([facility] only)     Status: None   Collection Time: 07/06/19 10:54 AM  Result Value Ref Range   Rom Plus NEGATIVE     Procedures: NST Baseline: 150 Variability: moderate Accelerations: present (10x10) Decelerations: absent Tocometry: none The patient was monitored for 30 minutes, fetal heart rate tracing was deemed reactive, category I tracing,  CPT [id number]   Discharge Condition: good  Disposition: Discharge disposition: 01-Home or Self Care       Diet: Regular diet  Discharge Activity: Activity as tolerated  Discharge Instructions    Discharge activity:  No Restrictions   Complete by: As directed    Discharge diet:  No restrictions   Complete by: As directed    No sexual activity restrictions   Complete by: As directed    Notify physician for a general feeling that "something is not right"   Complete by: As directed    Notify physician for increase or change in vaginal discharge   Complete by: As  directed    Notify physician for intestinal cramps, with or without diarrhea, sometimes described as "gas pain"   Complete by: As directed    Notify physician for leaking of fluid   Complete by: As directed    Notify physician for low, dull backache, unrelieved by heat or Tylenol   Complete by: As directed    Notify physician for menstrual like cramps   Complete by: As directed    Notify physician for pelvic pressure   Complete by: As directed    Notify physician for uterine contractions.  These may be painless and feel like the uterus is tightening or the baby is  "balling up"   Complete by: As directed    Notify physician for vaginal bleeding   Complete by: As directed    PRETERM LABOR:  Includes any of the follwing symptoms that occur between 20 - [redacted] weeks gestation.  If these symptoms are not stopped, preterm labor can result in preterm delivery, placing your baby at risk   Complete by: As directed      Allergies as of 07/06/2019      Reactions   Nutmeg Oil (myristica Oil) Anaphylaxis   Allergy to NUTS   Penicillins Anaphylaxis      Medication List    TAKE these medications   multivitamin-prenatal 27-0.8 MG Tabs tablet Take 1 tablet by mouth daily at 12 noon.   omeprazole 20 MG capsule Commonly known as: PRILOSEC Take 1 capsule (20 mg total) by mouth daily.        Total time spent taking care of this patient: 30 minutes  Signed: Malachy Mood 07/06/2019, 12:19 PM

## 2019-07-06 NOTE — OB Triage Note (Signed)
Pt presents to L&D with c/o leaking fluid. Pt reports feeling wetness at 2100 while in the shower. This morning pt reports voiding and 20 min later felt more leaking of fluid. Pt reports back pain for weeks, denies contraction or bleeding and reports normal fetal movement. EFM and toco applied and explained. Plan to monitor for ROM, fetal and maternal wellbeing

## 2019-07-06 NOTE — Discharge Instructions (Signed)
Call provider or return to birthplace with: ? ?1. Regular contractions ?2. Leaking of fluid from your vagina ?3. Vaginal bleeding: Bright red or heavy like a period ?4. Decreased Fetal movement  ?

## 2019-07-07 ENCOUNTER — Telehealth: Payer: Self-pay | Admitting: Obstetrics and Gynecology

## 2019-07-07 NOTE — Telephone Encounter (Signed)
Patient is schedule for 07/11/19 with AMS

## 2019-07-07 NOTE — Telephone Encounter (Signed)
-----   Message from Malachy Mood, MD sent at 07/06/2019 12:18 PM EDT ----- BP check in clinic sometime this week

## 2019-07-08 ENCOUNTER — Telehealth: Payer: Self-pay

## 2019-07-08 NOTE — Telephone Encounter (Signed)
Pt called triage after hours line reporting swelling in her legs and feet. She is aware to keep them elevated as much as possible and drink plenty of fluids '

## 2019-07-11 ENCOUNTER — Encounter: Payer: BC Managed Care – PPO | Admitting: Obstetrics and Gynecology

## 2019-07-11 ENCOUNTER — Other Ambulatory Visit: Payer: Self-pay

## 2019-07-11 ENCOUNTER — Ambulatory Visit (INDEPENDENT_AMBULATORY_CARE_PROVIDER_SITE_OTHER): Payer: BC Managed Care – PPO | Admitting: Obstetrics & Gynecology

## 2019-07-11 ENCOUNTER — Encounter: Payer: Self-pay | Admitting: Obstetrics & Gynecology

## 2019-07-11 VITALS — BP 138/88 | Wt 223.0 lb

## 2019-07-11 DIAGNOSIS — Z3A25 25 weeks gestation of pregnancy: Secondary | ICD-10-CM | POA: Diagnosis not present

## 2019-07-11 DIAGNOSIS — O162 Unspecified maternal hypertension, second trimester: Secondary | ICD-10-CM

## 2019-07-11 DIAGNOSIS — F419 Anxiety disorder, unspecified: Secondary | ICD-10-CM | POA: Diagnosis not present

## 2019-07-11 LAB — POCT URINALYSIS DIPSTICK OB
Glucose, UA: NEGATIVE
POC,PROTEIN,UA: NEGATIVE

## 2019-07-11 NOTE — Progress Notes (Signed)
Obstetrics & Gynecology Office Visit   Chief Complaint  Patient presents with  . Blood Pressure Check  . Edema    History of Present Illness: 29 y.o. G2P0010 being seen for follow up blood pressure check today.  The patient is currently pregnantThe established diagnosis for the patient is none as of yet; she had elevated BP when seen in hospital last week, but possiblky due to anxiety over the reason she was there (thought her water broke).  She is currently on no antihypertensives.  She reports swelling.  Medication list reviewed medications which may contribute to BP elevation were not noted.  Past Medical History:  History reviewed. No pertinent past medical history.  Past Surgical History:  Past Surgical History:  Procedure Laterality Date  . ablation     heart    Gynecologic History: Patient's last menstrual period was 01/14/2019 (exact date).  Obstetric History: G2P0010  Family History:  Family History  Problem Relation Age of Onset  . Cancer Mother   . Other Father        MDS    Social History:  Social History   Socioeconomic History  . Marital status: Single    Spouse name: Not on file  . Number of children: Not on file  . Years of education: Not on file  . Highest education level: Not on file  Occupational History  . Not on file  Tobacco Use  . Smoking status: Never Smoker  . Smokeless tobacco: Never Used  Substance and Sexual Activity  . Alcohol use: Yes  . Drug use: Never  . Sexual activity: Not on file  Other Topics Concern  . Not on file  Social History Narrative  . Not on file   Social Determinants of Health   Financial Resource Strain:   . Difficulty of Paying Living Expenses:   Food Insecurity:   . Worried About Charity fundraiser in the Last Year:   . Arboriculturist in the Last Year:   Transportation Needs:   . Film/video editor (Medical):   Marland Kitchen Lack of Transportation (Non-Medical):   Physical Activity:   . Days of  Exercise per Week:   . Minutes of Exercise per Session:   Stress:   . Feeling of Stress :   Social Connections:   . Frequency of Communication with Friends and Family:   . Frequency of Social Gatherings with Friends and Family:   . Attends Religious Services:   . Active Member of Clubs or Organizations:   . Attends Archivist Meetings:   Marland Kitchen Marital Status:   Intimate Partner Violence:   . Fear of Current or Ex-Partner:   . Emotionally Abused:   Marland Kitchen Physically Abused:   . Sexually Abused:     Allergies:  Allergies  Allergen Reactions  . Nutmeg Oil (Myristica Oil) Anaphylaxis    Allergy to NUTS  . Penicillins Anaphylaxis    Medications: Prior to Admission medications   Medication Sig Start Date End Date Taking? Authorizing Provider  omeprazole (PRILOSEC) 20 MG capsule Take 1 capsule (20 mg total) by mouth daily. 07/06/19   Malachy Mood, MD  Prenatal Vit-Fe Fumarate-FA (MULTIVITAMIN-PRENATAL) 27-0.8 MG TABS tablet Take 1 tablet by mouth daily at 12 noon.    [provider]    Review of Systems  Constitutional: Negative for chills, fever and malaise/fatigue.  HENT: Negative for congestion, sinus pain and sore throat.   Eyes: Negative for blurred vision and pain.  Respiratory: Negative for cough and wheezing.   Cardiovascular: Positive for leg swelling. Negative for chest pain.  Gastrointestinal: Negative for abdominal pain, constipation, diarrhea, heartburn, nausea and vomiting.  Genitourinary: Negative for dysuria, frequency, hematuria and urgency.  Musculoskeletal: Negative for back pain, joint pain, myalgias and neck pain.  Skin: Negative for itching and rash.  Neurological: Negative for dizziness, tremors and weakness.  Endo/Heme/Allergies: Does not bruise/bleed easily.  Psychiatric/Behavioral: Negative for depression. The patient is not nervous/anxious and does not have insomnia.     Physical Exam Blood pressure 138/88, weight 223 lb (101.2 kg),  last menstrual period 01/14/2019.  General: NAD Neck- No carotid bruits HEENT: normocephalic, anicteric Pulmonary: No increased work of breathing Cardiovascular: RRR, distal pulses 2+ Extremities: TRACE edema, no erythema, no tenderness Neurologic: Grossly intact Psychiatric: mood appropriate, affect full Abd: gravid, NT, ND FHT 140s  Assessment: 29 y.o. G2P0010 presenting for blood pressure evaluation today  Plan: Problem List Items Addressed This Visit    Hypertension affecting pregnancy in second trimester    -  Primary   Anxiety        1) Blood pressure - blood pressure at today's visit is normotensive (higher baseline for patient but still in normal range)   As a result antihypertensive therapy is currently not warranted. - additional blood work was not obtained - monitor edema - sx's of preeclampsia discussed w pt to watch for  A total of 20 minutes were spent face-to-face with the patient as well as preparation, review, communication, and documentation during this encounter.   Barnett Applebaum, MD, [facility], [facility] 07/11/2019  2:48 PM

## 2019-07-11 NOTE — Addendum Note (Signed)
Addended by: Quintella Baton D on: 07/11/2019 03:00 PM   Modules accepted: Orders

## 2019-07-14 ENCOUNTER — Other Ambulatory Visit: Payer: Self-pay

## 2019-07-14 ENCOUNTER — Telehealth: Payer: Self-pay

## 2019-07-14 ENCOUNTER — Ambulatory Visit (INDEPENDENT_AMBULATORY_CARE_PROVIDER_SITE_OTHER): Payer: BC Managed Care – PPO | Admitting: Certified Nurse Midwife

## 2019-07-14 VITALS — BP 130/66 | Wt 220.0 lb

## 2019-07-14 DIAGNOSIS — Z3A25 25 weeks gestation of pregnancy: Secondary | ICD-10-CM

## 2019-07-14 DIAGNOSIS — O368120 Decreased fetal movements, second trimester, not applicable or unspecified: Secondary | ICD-10-CM

## 2019-07-14 DIAGNOSIS — Z3689 Encounter for other specified antenatal screening: Secondary | ICD-10-CM

## 2019-07-14 LAB — FETAL NONSTRESS TEST

## 2019-07-14 LAB — POCT URINALYSIS DIPSTICK OB
Glucose, UA: NEGATIVE
POC,PROTEIN,UA: NEGATIVE

## 2019-07-14 NOTE — Telephone Encounter (Signed)
Pt called triage line stating sheis 27 weeks and  has been having decrease fetal movement.  Clarise Cruz, please work her in with [place] today.

## 2019-07-14 NOTE — Telephone Encounter (Signed)
Patient is schedule for 1:30 today

## 2019-07-15 NOTE — Progress Notes (Signed)
Work in for decreased fetal movement at [employer]. Has not felt baby move since yesterday. Last ate this AM. Had Frappacino on way to work. Has anterior fundal placenta.  NST: baseline 145 with accelerations to 160, moderate variability Patient able to feel baby move 15 times in 15 minutes during monitoring  A: IUP at 25wk6d with reactive NST  P: Discussed fetal movement counting RTO as scheduled and prn  Dalia Heading, CNM

## 2019-07-25 ENCOUNTER — Observation Stay
Admission: EM | Admit: 2019-07-25 | Discharge: 2019-07-25 | Disposition: A | Discharge status: Home / Self Care | Payer: BC Managed Care – PPO | Attending: Obstetrics & Gynecology | Admitting: Obstetrics & Gynecology

## 2019-07-25 ENCOUNTER — Encounter: Payer: BC Managed Care – PPO | Admitting: Obstetrics & Gynecology

## 2019-07-25 ENCOUNTER — Observation Stay: Payer: BC Managed Care – PPO

## 2019-07-25 ENCOUNTER — Encounter: Payer: Self-pay | Admitting: Obstetrics & Gynecology

## 2019-07-25 ENCOUNTER — Other Ambulatory Visit: Payer: Self-pay

## 2019-07-25 ENCOUNTER — Telehealth: Payer: Self-pay

## 2019-07-25 DIAGNOSIS — Z79899 Other long term (current) drug therapy: Secondary | ICD-10-CM | POA: Insufficient documentation

## 2019-07-25 DIAGNOSIS — O26892 Other specified pregnancy related conditions, second trimester: Secondary | ICD-10-CM | POA: Diagnosis not present

## 2019-07-25 DIAGNOSIS — Z3A27 27 weeks gestation of pregnancy: Secondary | ICD-10-CM | POA: Insufficient documentation

## 2019-07-25 DIAGNOSIS — I456 Pre-excitation syndrome: Secondary | ICD-10-CM | POA: Insufficient documentation

## 2019-07-25 DIAGNOSIS — O99412 Diseases of the circulatory system complicating pregnancy, second trimester: Secondary | ICD-10-CM | POA: Insufficient documentation

## 2019-07-25 DIAGNOSIS — M79662 Pain in left lower leg: Secondary | ICD-10-CM | POA: Diagnosis not present

## 2019-07-25 DIAGNOSIS — Z3480 Encounter for supervision of other normal pregnancy, unspecified trimester: Secondary | ICD-10-CM

## 2019-07-25 DIAGNOSIS — O99210 Obesity complicating pregnancy, unspecified trimester: Secondary | ICD-10-CM

## 2019-07-25 LAB — COMPREHENSIVE METABOLIC PANEL
ALT: 19 U/L (ref 0–44)
AST: 21 U/L (ref 15–41)
Albumin: 3.1 g/dL — ABNORMAL LOW (ref 3.5–5.0)
Alkaline Phosphatase: 81 U/L (ref 38–126)
Anion gap: 8 (ref 5–15)
BUN: <5 mg/dL — ABNORMAL LOW (ref 6–20)
CO2: 21 mmol/L — ABNORMAL LOW (ref 22–32)
Calcium: 8.9 mg/dL (ref 8.9–10.3)
Chloride: 109 mmol/L (ref 98–111)
Creatinine, Ser: 0.46 mg/dL (ref 0.44–1.00)
GFR calc Af Amer: >60 mL/min (ref >60)
GFR calc non Af Amer: >60 mL/min (ref >60)
Glucose, Bld: 100 mg/dL — ABNORMAL HIGH (ref 70–99)
Potassium: 3.8 mmol/L (ref 3.5–5.1)
Sodium: 138 mmol/L (ref 135–145)
Total Bilirubin: 0.4 mg/dL (ref 0.3–1.2)
Total Protein: 6.8 g/dL (ref 6.5–8.1)

## 2019-07-25 LAB — CBC
HCT: 31 % — ABNORMAL LOW (ref 36.0–46.0)
Hemoglobin: 10.5 g/dL — ABNORMAL LOW (ref 12.0–15.0)
MCH: 29 pg (ref 26.0–34.0)
MCHC: 33.9 g/dL (ref 30.0–36.0)
MCV: 85.6 fL (ref 80.0–100.0)
Platelets: 274 10*3/uL (ref 150–400)
RBC: 3.62 MIL/uL — ABNORMAL LOW (ref 3.87–5.11)
RDW: 12.5 % (ref 11.5–15.5)
WBC: 10.5 10*3/uL (ref 4.0–10.5)
nRBC: 0 % (ref 0.0–0.2)

## 2019-07-25 LAB — PROTEIN / CREATININE RATIO, URINE
Creatinine, Urine: 102 mg/dL
Protein Creatinine Ratio: 0.1 mg/mg{Cre} (ref 0.00–0.15)
Total Protein, Urine: 10 mg/dL

## 2019-07-25 MED ORDER — ONDANSETRON HCL 4 MG/2ML IJ SOLN: 4.0000 mg | Freq: Four times a day (QID) | INTRAMUSCULAR | Status: DC | PRN

## 2019-07-25 MED ORDER — ACETAMINOPHEN 325 MG PO TABS: 650.0000 mg | ORAL_TABLET | ORAL | Status: DC | PRN

## 2019-07-25 MED ORDER — LIDOCAINE HCL (PF) 1 % IJ SOLN: 30.0000 mL | INTRAMUSCULAR | Status: DC | PRN

## 2019-07-25 NOTE — Discharge Summary (Signed)
Physician Discharge Summary  Patient ID: Suzanne Guzman MRN: [id number] DOB/AGE: Feb 05, 1991 29 y.o.  Admit date: 07/25/2019 Discharge date: 07/25/2019  Admission Diagnoses: Left calf pain  Discharge Diagnoses:  Active Problems:   Pain of left calf   Discharged Condition: good  Hospital Course: Pt seen and examined.  Imaging studies show no sign of DVT.  Labs show no signs of preeclampsia.   Consults: None  Significant Diagnostic Studies: labs:  Results for orders placed or performed during the hospital encounter of 07/25/19  Protein / creatinine ratio, urine  Result Value Ref Range   Creatinine, Urine 102 mg/dL   Total Protein, Urine 10 mg/dL   Protein Creatinine Ratio 0.10 0.00 - 0.15 mg/mg[Cre]  CBC  Result Value Ref Range   WBC 10.5 4.0 - 10.5 K/uL   RBC 3.62 (L) 3.87 - 5.11 MIL/uL   Hemoglobin 10.5 (L) 12.0 - 15.0 g/dL   HCT 31.0 (L) 36.0 - 46.0 %   MCV 85.6 80.0 - 100.0 fL   MCH 29.0 26.0 - 34.0 pg   MCHC 33.9 30.0 - 36.0 g/dL   RDW 12.5 11.5 - 15.5 %   Platelets 274 150 - 400 K/uL   nRBC 0.0 0.0 - 0.2 %  Comprehensive metabolic panel  Result Value Ref Range   Sodium 138 135 - 145 mmol/L   Potassium 3.8 3.5 - 5.1 mmol/L   Chloride 109 98 - 111 mmol/L   CO2 21 (L) 22 - 32 mmol/L   Glucose, Bld 100 (H) 70 - 99 mg/dL   BUN <5 (L) 6 - 20 mg/dL   Creatinine, Ser 0.46 0.44 - 1.00 mg/dL   Calcium 8.9 8.9 - 10.3 mg/dL   Total Protein 6.8 6.5 - 8.1 g/dL   Albumin 3.1 (L) 3.5 - 5.0 g/dL   AST 21 15 - 41 U/L   ALT 19 0 - 44 U/L   Alkaline Phosphatase 81 38 - 126 U/L   Total Bilirubin 0.4 0.3 - 1.2 mg/dL   GFR calc non Af Amer >60 >60 mL/min   GFR calc Af Amer >60 >60 mL/min   Anion gap 8 5 - 15    Treatments: none  Discharge Exam: Blood pressure (!) 141/84, pulse 94, temperature 98.2 F (36.8 C), temperature source Oral, resp. rate 17, height 5\' 3"  (1.6 m), weight 100.7 kg, last menstrual period 01/14/2019. See H&P  Disposition: Discharge disposition:  01-Home or Self Care          Signed: Hoyt Koch 07/25/2019, 11:04 PM

## 2019-07-25 NOTE — Telephone Encounter (Signed)
Pt called triage reporting a pain in her leg she had a charlie horse and the pain is still lingering. She has a rob apt on Monday, she was just wondering if she should be concerned about the pain, its not too bad but its there, Please advise

## 2019-07-25 NOTE — Progress Notes (Signed)
MD placed discharge orders. Reviewed discharge instructions with pt and pt verbalized understanding. Discussed pain management and encouraged pt to elevate lower extremities. Pt stable at time of discharge and rates pain 0/10 on a 0-10 pain scale. Pt has follow up appt on Monday. Pt denied any further needs at this time.

## 2019-07-25 NOTE — H&P (Signed)
Obstetrics Admission History & Physical   Leg Swelling   HPI:  29 y.o. G1P0000 @ [redacted]w[redacted]d (10/21/2019, by Last Menstrual Period). Admitted on 07/25/2019:     Presents for left lower leg calf pain that began yesterday am, has persisted to today, feels deep to calf and in same spot throughout, no radiation, no associated sx's, no modifiers, context includes pregnancy, no prior h/o pain or DVT olr clotting disorder. Ambulates well and no change in activity.  Prenatal care at: at [facility]. Pregnancy complicated by history of cardiac ablation for [facility]..  ROS: A review of systems was performed and negative, except as stated in the above HPI. PMHx:  Past Medical History:  Diagnosis Date  . Medical history non-contributory    PSHx:  Past Surgical History:  Procedure Laterality Date  . ablation     heart   Medications:  Medications Prior to Admission  Medication Sig Dispense Refill Last Dose  . calcium carbonate (TUMS - DOSED IN MG ELEMENTAL CALCIUM) 500 MG chewable tablet Chew 2 tablets by mouth as needed for indigestion or heartburn.   Past Week at Unknown time  . omeprazole (PRILOSEC) 20 MG capsule Take 1 capsule (20 mg total) by mouth daily. 30 capsule 11 07/25/2019 at Unknown time  . Prenatal Vit-Fe Fumarate-FA (MULTIVITAMIN-PRENATAL) 27-0.8 MG TABS tablet Take 1 tablet by mouth daily at 12 noon.   07/24/2019 at Unknown time   Allergies: is allergic to nutmeg oil (myristica oil) and penicillins. OBHx:  OB History  Gravida Para Term Preterm AB Living  1       0    SAB TAB Ectopic Multiple Live Births               # Outcome Date GA Lbr Len/2nd Weight Sex Delivery Anes PTL Lv  1 Current            [id number] except as detailed in HPI.Marland Kitchen  No family history of birth defects. Soc Hx: Alcohol: none and Recreational drug use: none  Objective:   Vitals:   07/25/19 1752 07/25/19 1754  BP: (!) 149/93 135/79  Pulse: (!) 102 96  Resp: 18   Temp: 98.6  F (37 C)    Constitutional: Well nourished, well developed female in no acute distress.  HEENT: normal Skin: Warm and dry.  Cardiovascular:Regular rate and rhythm.   Extremity: no palpable cords, negative Homans, min pedal edema Respiratory: Clear to auscultation bilateral. Normal respiratory effort Abdomen: gravid, ND, FHT present, without guarding, without rebound tenderness on exam Back: no CVAT Neuro: DTRs 2+, Cranial nerves grossly intact Psych: Alert and Oriented x3. No memory deficits. Normal mood and affect.  MS: normal gait, normal bilateral lower extremity ROM/strength/stability. FHT 140s   Perinatal info:  Blood type: A positive Rubella- Immune Varicella -Immune TDaP tetanus status unknown to the patient RPR NR / HIV Neg/ HBsAg Neg   Assessment & Plan:   29 y.o. G1P0000 @ [redacted]w[redacted]d, Admitted on 07/25/2019:Calf pain on left, evaluate for DVT as patient is at higher risk based on sx's and pregnancy.  This is a new problem and new work up for the patient.    Doppler US left lower extremity  Also will check preeclampsia labs as she has some elevated BPs on admission, may be related to anxiety and stress of coming to hospital  Barnett Applebaum, MD, [place], [place] 07/25/2019  6:04 PM

## 2019-07-25 NOTE — Telephone Encounter (Signed)
Please work in.

## 2019-07-25 NOTE — Telephone Encounter (Signed)
Patient is schedule today at 07/25/19 at 4:30

## 2019-07-28 ENCOUNTER — Other Ambulatory Visit: Payer: BC Managed Care – PPO

## 2019-07-28 ENCOUNTER — Other Ambulatory Visit: Payer: Self-pay

## 2019-07-28 ENCOUNTER — Encounter: Payer: BC Managed Care – PPO | Admitting: Obstetrics & Gynecology

## 2019-07-28 ENCOUNTER — Encounter: Payer: Self-pay | Admitting: Obstetrics & Gynecology

## 2019-07-28 ENCOUNTER — Ambulatory Visit (INDEPENDENT_AMBULATORY_CARE_PROVIDER_SITE_OTHER): Payer: BC Managed Care – PPO | Admitting: Obstetrics & Gynecology

## 2019-07-28 VITALS — BP 122/80 | Wt 222.0 lb

## 2019-07-28 DIAGNOSIS — Z3483 Encounter for supervision of other normal pregnancy, third trimester: Secondary | ICD-10-CM

## 2019-07-28 DIAGNOSIS — O99210 Obesity complicating pregnancy, unspecified trimester: Secondary | ICD-10-CM

## 2019-07-28 DIAGNOSIS — Z131 Encounter for screening for diabetes mellitus: Secondary | ICD-10-CM | POA: Diagnosis not present

## 2019-07-28 DIAGNOSIS — Z3A27 27 weeks gestation of pregnancy: Secondary | ICD-10-CM

## 2019-07-28 LAB — POCT URINALYSIS DIPSTICK OB
Glucose, UA: NEGATIVE
POC,PROTEIN,UA: NEGATIVE

## 2019-07-28 NOTE — Patient Instructions (Signed)
Third Trimester of Pregnancy The third trimester is from week 28 through week 40 (months 7 through 9). The third trimester is a time when the unborn baby (fetus) is growing rapidly. At the end of the ninth month, the fetus is about 20 inches in length and weighs 6-10 pounds. Body changes during your third trimester Your body will continue to go through many changes during pregnancy. The changes vary from woman to woman. During the third trimester:  Your weight will continue to increase. You can expect to gain 25-35 pounds (11-16 kg) by the end of the pregnancy.  You may begin to get stretch marks on your hips, abdomen, and breasts.  You may urinate more often because the fetus is moving lower into your pelvis and pressing on your bladder.  You may develop or continue to have heartburn. This is caused by increased hormones that slow down muscles in the digestive tract.  You may develop or continue to have constipation because increased hormones slow digestion and cause the muscles that push waste through your intestines to relax.  You may develop hemorrhoids. These are swollen veins (varicose veins) in the rectum that can itch or be painful.  You may develop swollen, bulging veins (varicose veins) in your legs.  You may have increased body aches in the pelvis, back, or thighs. This is due to weight gain and increased hormones that are relaxing your joints.  You may have changes in your hair. These can include thickening of your hair, rapid growth, and changes in texture. Some women also have hair loss during or after pregnancy, or hair that feels dry or thin. Your hair will most likely return to normal after your baby is born.  Your breasts will continue to grow and they will continue to become tender. A yellow fluid (colostrum) may leak from your breasts. This is the first milk you are producing for your baby.  Your belly button may stick out.  You may notice more swelling in your hands,  face, or ankles.  You may have increased tingling or numbness in your hands, arms, and legs. The skin on your belly may also feel numb.  You may feel short of breath because of your expanding uterus.  You may have more problems sleeping. This can be caused by the size of your belly, increased need to urinate, and an increase in your body's metabolism.  You may notice the fetus "dropping," or moving lower in your abdomen (lightening).  You may have increased vaginal discharge.  You may notice your joints feel loose and you may have pain around your pelvic bone. What to expect at prenatal visits You will have prenatal exams every 2 weeks until week 36. Then you will have weekly prenatal exams. During a routine prenatal visit:  You will be weighed to make sure you and the baby are growing normally.  Your blood pressure will be taken.  Your abdomen will be measured to track your baby's growth.  The fetal heartbeat will be listened to.  Any test results from the previous visit will be discussed.  You may have a cervical check near your due date to see if your cervix has softened or thinned (effaced).  You will be tested for Group B streptococcus. This happens between 35 and 37 weeks. Your health care provider may ask you:  What your birth plan is.  How you are feeling.  If you are feeling the baby move.  If you have had any abnormal   symptoms, such as leaking fluid, bleeding, severe headaches, or abdominal cramping.  If you are using any tobacco products, including cigarettes, chewing tobacco, and electronic cigarettes.  If you have any questions. Other tests or screenings that may be performed during your third trimester include:  Blood tests that check for low iron levels (anemia).  Fetal testing to check the health, activity level, and growth of the fetus. Testing is done if you have certain medical conditions or if there are problems during the pregnancy.  Nonstress test  (NST). This test checks the health of your baby to make sure there are no signs of problems, such as the baby not getting enough oxygen. During this test, a belt is placed around your belly. The baby is made to move, and its heart rate is monitored during movement. What is false labor? False labor is a condition in which you feel small, irregular tightenings of the muscles in the womb (contractions) that usually go away with rest, changing position, or drinking water. These are called Braxton Hicks contractions. Contractions may last for hours, days, or even weeks before true labor sets in. If contractions come at regular intervals, become more frequent, increase in intensity, or become painful, you should see your health care provider. What are the signs of labor?  Abdominal cramps.  Regular contractions that start at 10 minutes apart and become stronger and more frequent with time.  Contractions that start on the top of the uterus and spread down to the lower abdomen and back.  Increased pelvic pressure and dull back pain.  A watery or bloody mucus discharge that comes from the vagina.  Leaking of amniotic fluid. This is also known as your "water breaking." It could be a slow trickle or a gush. Let your health care provider know if it has a color or strange odor. If you have any of these signs, call your health care provider right away, even if it is before your due date. Follow these instructions at home: Medicines  Follow your health care provider's instructions regarding medicine use. Specific medicines may be either safe or unsafe to take during pregnancy.  Take a prenatal vitamin that contains at least 600 micrograms (mcg) of folic acid.  If you develop constipation, try taking a stool softener if your health care provider approves. Eating and drinking   Eat a balanced diet that includes fresh fruits and vegetables, whole grains, good sources of protein such as meat, eggs, or tofu,  and low-fat dairy. Your health care provider will help you determine the amount of weight gain that is right for you.  Avoid raw meat and uncooked cheese. These carry germs that can cause birth defects in the baby.  If you have low calcium intake from food, talk to your health care provider about whether you should take a daily calcium supplement.  Eat four or five small meals rather than three large meals a day.  Limit foods that are high in fat and processed sugars, such as fried and sweet foods.  To prevent constipation: ? Drink enough fluid to keep your urine clear or pale yellow. ? Eat foods that are high in fiber, such as fresh fruits and vegetables, whole grains, and beans. Activity  Exercise only as directed by your health care provider. Most women can continue their usual exercise routine during pregnancy. Try to exercise for 30 minutes at least 5 days a week. Stop exercising if you experience uterine contractions.  Avoid heavy lifting.  Do   not exercise in extreme heat or humidity, or at high altitudes.  Wear low-heel, comfortable shoes.  Practice good posture.  You may continue to have sex unless your health care provider tells you otherwise. Relieving pain and discomfort  Take frequent breaks and rest with your legs elevated if you have leg cramps or low back pain.  Take warm sitz baths to soothe any pain or discomfort caused by hemorrhoids. Use hemorrhoid cream if your health care provider approves.  Wear a good support bra to prevent discomfort from breast tenderness.  If you develop varicose veins: ? Wear support pantyhose or compression stockings as told by your healthcare provider. ? Elevate your feet for 15 minutes, 3-4 times a day. Prenatal care  Write down your questions. Take them to your prenatal visits.  Keep all your prenatal visits as told by your health care provider. This is important. Safety  Wear your seat belt at all times when driving.  Make  a list of emergency phone numbers, including numbers for family, friends, the hospital, and police and fire departments. General instructions  Avoid cat litter boxes and soil used by cats. These carry germs that can cause birth defects in the baby. If you have a cat, ask someone to clean the litter box for you.  Do not travel far distances unless it is absolutely necessary and only with the approval of your health care provider.  Do not use hot tubs, steam rooms, or saunas.  Do not drink alcohol.  Do not use any products that contain nicotine or tobacco, such as cigarettes and e-cigarettes. If you need help quitting, ask your health care provider.  Do not use any medicinal herbs or unprescribed drugs. These chemicals affect the formation and growth of the baby.  Do not douche or use tampons or scented sanitary pads.  Do not cross your legs for long periods of time.  To prepare for the arrival of your baby: ? Take prenatal classes to understand, practice, and ask questions about labor and delivery. ? Make a trial run to the hospital. ? Visit the hospital and tour the maternity area. ? Arrange for maternity or paternity leave through employers. ? Arrange for family and friends to take care of pets while you are in the hospital. ? Purchase a rear-facing car seat and make sure you know how to install it in your car. ? Pack your hospital bag. ? Prepare the baby's nursery. Make sure to remove all pillows and stuffed animals from the baby's crib to prevent suffocation.  Visit your dentist if you have not gone during your pregnancy. Use a soft toothbrush to brush your teeth and be gentle when you floss. Contact a health care provider if:  You are unsure if you are in labor or if your water has broken.  You become dizzy.  You have mild pelvic cramps, pelvic pressure, or nagging pain in your abdominal area.  You have lower back pain.  You have persistent nausea, vomiting, or  diarrhea.  You have an unusual or bad smelling vaginal discharge.  You have pain when you urinate. Get help right away if:  Your water breaks before 37 weeks.  You have regular contractions less than 5 minutes apart before 37 weeks.  You have a fever.  You are leaking fluid from your vagina.  You have spotting or bleeding from your vagina.  You have severe abdominal pain or cramping.  You have rapid weight loss or weight gain.  You have   shortness of breath with chest pain.  You notice sudden or extreme swelling of your face, hands, ankles, feet, or legs.  Your baby makes fewer than 10 movements in 2 hours.  You have severe headaches that do not go away when you take medicine.  You have vision changes. Summary  The third trimester is from week 28 through week 40, months 7 through 9. The third trimester is a time when the unborn baby (fetus) is growing rapidly.  During the third trimester, your discomfort may increase as you and your baby continue to gain weight. You may have abdominal, leg, and back pain, sleeping problems, and an increased need to urinate.  During the third trimester your breasts will keep growing and they will continue to become tender. A yellow fluid (colostrum) may leak from your breasts. This is the first milk you are producing for your baby.  False labor is a condition in which you feel small, irregular tightenings of the muscles in the womb (contractions) that eventually go away. These are called Braxton Hicks contractions. Contractions may last for hours, days, or even weeks before true labor sets in.  Signs of labor can include: abdominal cramps; regular contractions that start at 10 minutes apart and become stronger and more frequent with time; watery or bloody mucus discharge that comes from the vagina; increased pelvic pressure and dull back pain; and leaking of amniotic fluid. This information is not intended to replace advice given to you by your  health care provider. Make sure you discuss any questions you have with your health care provider. Document Revised: 06/27/2018 Document Reviewed: 04/11/2016 Elsevier Patient Education  2020 [employer]  

## 2019-07-28 NOTE — Progress Notes (Signed)
  Subjective  Fetal Movement? yes Contractions? no Leaking Fluid? no Vaginal Bleeding? no  Objective  BP 122/80   Wt 222 lb (100.7 kg)   LMP 01/14/2019 (Exact Date)   BMI 39.33 kg/m  General: NAD Pumonary: no increased work of breathing Abdomen: gravid, non-tender Extremities: no edema Psychiatric: mood appropriate, affect full  Assessment  29 y.o. G1P0000 at [redacted]w[redacted]d by  10/21/2019, by Last Menstrual Period presenting for routine prenatal visit  Plan   Problem List Items Addressed This Visit      Other   Supervision of other normal pregnancy, antepartum   Obesity affecting pregnancy, antepartum    Other Visit Diagnoses    [redacted] weeks gestation of pregnancy    -  Primary      Pregnancy#2 Problems (from 01/14/19 to present)    Problem Noted Resolved   Supervision of other normal pregnancy, antepartum 03/13/2019 by Rod Can, CNM No   Overview Addendum 07/06/2019 11:06 AM by Malachy Mood, [place] Prenatal Labs  Dating By LMP c/w 6w @ [facility] Blood type: A/Positive/-- (01/19 1046)   Genetic Screen NIPS: normal XY Antibody:Negative (01/19 1046)  Anatomic [place] Normal Female Rubella: 1.79 (01/19 1046) Varicella: Immune  GTT Early: 116    28 wk:  RPR: Non Reactive (01/19 1046)   Rhogam N/A HBsAg: Negative (01/19 1046)   Vaccines TDAP:                       Flu Shot: HIV: Non Reactive (01/19 1046)   Baby Food Breast                               GBS:   Contraception POP Pap: 03/13/19:  CBB  No Hgb AA  CS/VBAC NA   Support Person Legrand Como          Obesity affecting pregnancy, antepartum 03/13/2019 by Rod Can, CNM No       Barnett Applebaum, MD, [place] 07/28/2019  3:30 PM

## 2019-07-28 NOTE — Addendum Note (Signed)
Addended by: Quintella Baton D on: 07/28/2019 03:34 PM   Modules accepted: Orders

## 2019-07-29 LAB — 28 WEEK RH+PANEL
Basophils Absolute: 0 10*3/uL (ref 0.0–0.2)
Basos: 0 %
EOS (ABSOLUTE): 0.1 10*3/uL (ref 0.0–0.4)
Eos: 1 %
Gestational Diabetes Screen: 125 mg/dL (ref 65–139)
HIV Screen 4th Generation wRfx: NONREACTIVE
Hematocrit: 32 % — ABNORMAL LOW (ref 34.0–46.6)
Hemoglobin: 10.8 g/dL — ABNORMAL LOW (ref 11.1–15.9)
Immature Grans (Abs): 0.1 10*3/uL (ref 0.0–0.1)
Immature Granulocytes: 1 %
Lymphocytes Absolute: 1.4 10*3/uL (ref 0.7–3.1)
Lymphs: 13 %
MCH: 28.6 pg (ref 26.6–33.0)
MCHC: 33.8 g/dL (ref 31.5–35.7)
MCV: 85 fL (ref 79–97)
Monocytes Absolute: 0.5 10*3/uL (ref 0.1–0.9)
Monocytes: 4 %
Neutrophils Absolute: 8.8 10*3/uL — ABNORMAL HIGH (ref 1.4–7.0)
Neutrophils: 81 %
Platelets: 303 10*3/uL (ref 150–450)
RBC: 3.77 x10E6/uL (ref 3.77–5.28)
RDW: 12.2 % (ref 11.7–15.4)
RPR Ser Ql: NONREACTIVE
WBC: 11 10*3/uL — ABNORMAL HIGH (ref 3.4–10.8)

## 2019-08-05 DIAGNOSIS — H6121 Impacted cerumen, right ear: Secondary | ICD-10-CM | POA: Diagnosis not present

## 2019-08-11 ENCOUNTER — Ambulatory Visit (INDEPENDENT_AMBULATORY_CARE_PROVIDER_SITE_OTHER): Payer: BC Managed Care – PPO | Admitting: Obstetrics & Gynecology

## 2019-08-11 ENCOUNTER — Other Ambulatory Visit: Payer: Self-pay

## 2019-08-11 ENCOUNTER — Encounter: Payer: Self-pay | Admitting: Obstetrics & Gynecology

## 2019-08-11 VITALS — BP 130/80 | Wt 226.0 lb

## 2019-08-11 DIAGNOSIS — O99210 Obesity complicating pregnancy, unspecified trimester: Secondary | ICD-10-CM

## 2019-08-11 DIAGNOSIS — O0993 Supervision of high risk pregnancy, unspecified, third trimester: Secondary | ICD-10-CM

## 2019-08-11 DIAGNOSIS — Z3A29 29 weeks gestation of pregnancy: Secondary | ICD-10-CM

## 2019-08-11 LAB — POCT URINALYSIS DIPSTICK OB
Glucose, UA: NEGATIVE
POC,PROTEIN,UA: NEGATIVE

## 2019-08-11 NOTE — Patient Instructions (Signed)
Fetal Movement Counts Patient Name: ________________________________________________ Patient Due Date: ____________________ What is a fetal movement count?  A fetal movement count is the number of times that you feel your baby move during a certain amount of time. This may also be called a fetal kick count. A fetal movement count is recommended for every pregnant woman. You may be asked to start counting fetal movements as early as week 28 of your pregnancy. Pay attention to when your baby is most active. You may notice your baby's sleep and wake cycles. You may also notice things that make your baby move more. You should do a fetal movement count:  When your baby is normally most active.  At the same time each day. A good time to count movements is while you are resting, after having something to eat and drink. How do I count fetal movements? 1. Find a quiet, comfortable area. Sit, or lie down on your side. 2. Write down the date, the start time and stop time, and the number of movements that you felt between those two times. Take this information with you to your health care visits. 3. Write down your start time when you feel the first movement. 4. Count kicks, flutters, swishes, rolls, and jabs. You should feel at least 10 movements. 5. You may stop counting after you have felt 10 movements, or if you have been counting for 2 hours. Write down the stop time. 6. If you do not feel 10 movements in 2 hours, contact your health care provider for further instructions. Your health care provider may want to do additional tests to assess your baby's well-being. Contact a health care provider if:  You feel fewer than 10 movements in 2 hours.  Your baby is not moving like he or she usually does. Date: ____________ Start time: ____________ Stop time: ____________ Movements: ____________ Date: ____________ Start time: ____________ Stop time: ____________ Movements: ____________ Date: ____________  Start time: ____________ Stop time: ____________ Movements: ____________ Date: ____________ Start time: ____________ Stop time: ____________ Movements: ____________ Date: ____________ Start time: ____________ Stop time: ____________ Movements: ____________ Date: ____________ Start time: ____________ Stop time: ____________ Movements: ____________ Date: ____________ Start time: ____________ Stop time: ____________ Movements: ____________ Date: ____________ Start time: ____________ Stop time: ____________ Movements: ____________ Date: ____________ Start time: ____________ Stop time: ____________ Movements: ____________ This information is not intended to replace advice given to you by your health care provider. Make sure you discuss any questions you have with your health care provider. Document Revised: 10/24/2018 Document Reviewed: 10/24/2018 Elsevier Patient Education  2020 Elsevier Inc.  

## 2019-08-11 NOTE — Progress Notes (Signed)
  Subjective  Fetal Movement? yes Contractions? no Leaking Fluid? no Vaginal Bleeding? no  Objective  BP 130/80   Wt 226 lb (102.5 kg)   LMP 01/14/2019 (Exact Date)   BMI 40.03 kg/m  General: NAD Pumonary: no increased work of breathing Abdomen: gravid, non-tender Extremities: no edema Psychiatric: mood appropriate, affect full  Assessment  29 y.o. G1P0000 at [redacted]w[redacted]d by  10/21/2019, by Last Menstrual Period presenting for routine prenatal visit  Plan   Problem List Items Addressed This Visit      Other   Supervision of high risk pregnancy, antepartum   Obesity affecting pregnancy, antepartum    Other Visit Diagnoses    [redacted] weeks gestation of pregnancy    -  Primary      Pregnancy#2 Problems (from 01/14/19 to present)    Problem Noted Resolved   Supervision of high risk pregnancy, antepartum     Overview Addendum 07/28/2019  3:31 PM by Gae Dry, MD    Clinic [facility] Prenatal Labs  Dating By LMP c/w 6w @ [facility] Blood type: A/Positive/-- (01/19 1046)   Genetic Screen NIPS: normal XY Antibody:Negative (01/19 1046)  Anatomic [place] Normal Female Rubella: 1.79 (01/19 1046) Varicella: Immune  GTT Early: 116    28 wk:  RPR: Non Reactive (01/19 1046)   Rhogam N/A HBsAg: Negative (01/19 1046)   Vaccines TDAP:                       Flu Shot: HIV: Non Reactive (01/19 1046)   Baby Food Breast                               GBS:   Contraception POP Pap: 03/13/19:  CBB  No Hgb AA  CS/VBAC NA   Support Person Legrand Como          Obesity affecting pregnancy, antepartum       Discussed pediatricians and expectations w newborn  TDaP nv  [place] for growth if continues to exemplify LGA  Barnett Applebaum, MD, [facility], [facility] 08/11/2019  3:05 PM

## 2019-08-11 NOTE — Addendum Note (Signed)
Addended by: Quintella Baton D on: 08/11/2019 03:38 PM   Modules accepted: Orders

## 2019-08-20 NOTE — Telephone Encounter (Signed)
Pt left msg on triage saying the itching is still there and it is bothering her. I looked in this msg and asked the pt if she had tried the zyrtec as advised by [facility], says she remembers reading the msg but does not remember what it said. Pt will try the zyrtec and follow up as needed.

## 2019-08-21 DIAGNOSIS — K831 Obstruction of bile duct: Secondary | ICD-10-CM | POA: Diagnosis not present

## 2019-08-21 DIAGNOSIS — L299 Pruritus, unspecified: Secondary | ICD-10-CM | POA: Diagnosis not present

## 2019-08-21 DIAGNOSIS — Z79899 Other long term (current) drug therapy: Secondary | ICD-10-CM | POA: Diagnosis not present

## 2019-08-21 DIAGNOSIS — K219 Gastro-esophageal reflux disease without esophagitis: Secondary | ICD-10-CM | POA: Diagnosis not present

## 2019-08-21 DIAGNOSIS — R109 Unspecified abdominal pain: Secondary | ICD-10-CM | POA: Diagnosis not present

## 2019-08-21 DIAGNOSIS — R1010 Upper abdominal pain, unspecified: Secondary | ICD-10-CM | POA: Diagnosis not present

## 2019-08-21 DIAGNOSIS — Z3A31 31 weeks gestation of pregnancy: Secondary | ICD-10-CM | POA: Diagnosis not present

## 2019-08-21 DIAGNOSIS — O99891 Other specified diseases and conditions complicating pregnancy: Secondary | ICD-10-CM | POA: Diagnosis not present

## 2019-08-21 DIAGNOSIS — O99213 Obesity complicating pregnancy, third trimester: Secondary | ICD-10-CM | POA: Diagnosis not present

## 2019-08-21 DIAGNOSIS — O10913 Unspecified pre-existing hypertension complicating pregnancy, third trimester: Secondary | ICD-10-CM | POA: Diagnosis not present

## 2019-08-21 DIAGNOSIS — O99613 Diseases of the digestive system complicating pregnancy, third trimester: Secondary | ICD-10-CM | POA: Diagnosis not present

## 2019-08-21 DIAGNOSIS — O99713 Diseases of the skin and subcutaneous tissue complicating pregnancy, third trimester: Secondary | ICD-10-CM | POA: Diagnosis not present

## 2019-08-21 DIAGNOSIS — O26613 Liver and biliary tract disorders in pregnancy, third trimester: Secondary | ICD-10-CM | POA: Diagnosis not present

## 2019-08-27 ENCOUNTER — Encounter: Payer: BC Managed Care – PPO | Admitting: Obstetrics

## 2019-08-29 ENCOUNTER — Other Ambulatory Visit: Payer: Self-pay

## 2019-08-29 ENCOUNTER — Ambulatory Visit (INDEPENDENT_AMBULATORY_CARE_PROVIDER_SITE_OTHER): Payer: BC Managed Care – PPO | Admitting: Certified Nurse Midwife

## 2019-08-29 VITALS — BP 128/80 | Wt 239.0 lb

## 2019-08-29 DIAGNOSIS — Z6841 Body Mass Index (BMI) 40.0 and over, adult: Secondary | ICD-10-CM

## 2019-08-29 DIAGNOSIS — O26613 Liver and biliary tract disorders in pregnancy, third trimester: Secondary | ICD-10-CM

## 2019-08-29 DIAGNOSIS — O26843 Uterine size-date discrepancy, third trimester: Secondary | ICD-10-CM

## 2019-08-29 DIAGNOSIS — O0993 Supervision of high risk pregnancy, unspecified, third trimester: Secondary | ICD-10-CM

## 2019-08-29 DIAGNOSIS — K831 Obstruction of bile duct: Secondary | ICD-10-CM

## 2019-08-29 DIAGNOSIS — Z3A32 32 weeks gestation of pregnancy: Secondary | ICD-10-CM

## 2019-08-29 DIAGNOSIS — O0990 Supervision of high risk pregnancy, unspecified, unspecified trimester: Secondary | ICD-10-CM

## 2019-08-29 LAB — POCT URINALYSIS DIPSTICK OB
Glucose, UA: NEGATIVE
POC,PROTEIN,UA: NEGATIVE

## 2019-08-29 NOTE — Progress Notes (Signed)
ROB- no concerns 

## 2019-08-30 DIAGNOSIS — O26613 Liver and biliary tract disorders in pregnancy, third trimester: Secondary | ICD-10-CM | POA: Insufficient documentation

## 2019-08-30 DIAGNOSIS — K831 Obstruction of bile duct: Secondary | ICD-10-CM | POA: Insufficient documentation

## 2019-08-30 NOTE — Progress Notes (Addendum)
ROB at [place]: Was seen at [facility] for sudden onset of "itching everywhere" and was diagnosed with cholestasis. Her bile acids were 1 and LFTs were WNL. She was begun on ursodiol 300 mgm tid and Zyrtec which has helped the itching. No rash present. Baby moving well. Has had increased swelling in her lower extremities.  Exam: BP 128/80  FH 41 cm. FHT 152 +2 edema of ankles/ feet/ +1 pitting edema pretibially. Weight gain of 13# in the last 2-3 weeks. Negative proeinuria No rash seen. +multiple striae on abdomen 28 week glucola 125. Hmg 10.8 gm/dl  A: IUP at 32wk3day with S>D Pruritis without rash: ?cholestasis of pregnancy BMI 42.34 gm/dl    P: RTO in 1 week for NST/ growth scan/ ROB/ TDAP Continue ursodiol Consider repeat bile acids NV Preeclampsia precautions DIscuss plan of management at [facility] meeting 14 June  Dalia Heading, [place]

## 2019-08-31 ENCOUNTER — Other Ambulatory Visit: Payer: Self-pay

## 2019-08-31 ENCOUNTER — Encounter: Payer: Self-pay | Admitting: Obstetrics & Gynecology

## 2019-08-31 ENCOUNTER — Observation Stay
Admission: EM | Admit: 2019-08-31 | Discharge: 2019-08-31 | Disposition: A | Discharge status: Home / Self Care | Payer: BC Managed Care – PPO | Attending: Obstetrics & Gynecology | Admitting: Obstetrics & Gynecology

## 2019-08-31 DIAGNOSIS — O26899 Other specified pregnancy related conditions, unspecified trimester: Secondary | ICD-10-CM | POA: Diagnosis not present

## 2019-08-31 DIAGNOSIS — R1030 Lower abdominal pain, unspecified: Secondary | ICD-10-CM

## 2019-08-31 DIAGNOSIS — K831 Obstruction of bile duct: Secondary | ICD-10-CM | POA: Diagnosis not present

## 2019-08-31 DIAGNOSIS — Z3A32 32 weeks gestation of pregnancy: Secondary | ICD-10-CM | POA: Insufficient documentation

## 2019-08-31 DIAGNOSIS — Z79899 Other long term (current) drug therapy: Secondary | ICD-10-CM | POA: Diagnosis not present

## 2019-08-31 DIAGNOSIS — O26613 Liver and biliary tract disorders in pregnancy, third trimester: Secondary | ICD-10-CM | POA: Diagnosis not present

## 2019-08-31 DIAGNOSIS — O99210 Obesity complicating pregnancy, unspecified trimester: Secondary | ICD-10-CM

## 2019-08-31 DIAGNOSIS — O0990 Supervision of high risk pregnancy, unspecified, unspecified trimester: Secondary | ICD-10-CM

## 2019-08-31 LAB — URINALYSIS, ROUTINE W REFLEX MICROSCOPIC
Bilirubin Urine: NEGATIVE
Glucose, UA: NEGATIVE mg/dL
Hgb urine dipstick: NEGATIVE
Ketones, ur: NEGATIVE mg/dL
Leukocytes,Ua: NEGATIVE
Nitrite: NEGATIVE
Protein, ur: NEGATIVE mg/dL
Specific Gravity, Urine: 1.013 (ref 1.005–1.030)
pH: 6 (ref 5.0–8.0)

## 2019-08-31 MED ORDER — ONDANSETRON HCL 4 MG/2ML IJ SOLN: 4.0000 mg | Freq: Four times a day (QID) | INTRAMUSCULAR | Status: DC | PRN

## 2019-08-31 MED ORDER — ACETAMINOPHEN 325 MG PO TABS: 650.0000 mg | ORAL_TABLET | ORAL | Status: DC | PRN

## 2019-08-31 MED ORDER — LIDOCAINE HCL (PF) 1 % IJ SOLN: 30.0000 mL | INTRAMUSCULAR | Status: DC | PRN

## 2019-08-31 NOTE — OB Triage Note (Signed)
Pt. reports to Labor & Delivery with complaints of abdominal pressure and cramping x1 week. Pt. does not even rate pain on a 0-10 scale. Pt. additionally complains of urinary urgency x1 week, but denies urinary frequency or pain upon urination, and has not noticed any blood in the urine. Denies LOF or vaginal bleeding. Reports good fetal movement. Initial vital signs show severe range BP at 160/94, but BP 15 minutes later is only slightly elevated at 136/84. Pt. reports a HA today and her feet are swollen, but denies all other PIH symptoms. External FHR and Toco applied. Will continue to monitor.

## 2019-08-31 NOTE — Discharge Summary (Signed)
See FPN

## 2019-08-31 NOTE — Final Progress Note (Signed)
Physician Final Progress Note  Patient ID: Suzanne Guzman MRN: [id number] DOB/AGE: 1990-06-17 29 y.o.  Admit date: 08/31/2019 Admitting provider: Gae Dry, MD Discharge date: 08/31/2019  Admission Diagnoses: Active Problems:   Pregnancy related abdominal pain of lower quadrant, antepartum  Discharge Diagnoses:  Active Problems:   Pregnancy related abdominal pain of lower quadrant, antepartum   Consults: None  Significant Findings/ Diagnostic Studies:  Obstetrics Admission History & Physical   No chief complaint on file.   HPI:  29 y.o. G1P0000 @ [redacted]w[redacted]d (10/21/2019, by Last Menstrual Period). Admitted on 08/31/2019:   Patient Active Problem List   Diagnosis Date Noted  . Pregnancy related abdominal pain of lower quadrant, antepartum 08/31/2019  . Cholestasis during pregnancy in third trimester 08/30/2019  . Pain of left calf 07/25/2019  . Labor and delivery indication for care or intervention 07/06/2019  . [redacted] weeks gestation of pregnancy   . Gastroesophageal reflux disease   . Supervision of high risk pregnancy, antepartum 03/13/2019  . Obesity affecting pregnancy, antepartum 03/13/2019     Presents for pain mostly in upper abdomen and right side, no radiation and pain is mild, no associated sx's; itching is better on ursodiol. No modifiers.  Context is pregnancy.  Denies VB, ROM.  Prenatal care at: at [facility]. Pregnancy complicated by none.  ROS: A review of systems was performed and negative, except as stated in the above HPI. PMHx:  Past Medical History:  Diagnosis Date  . Medical history non-contributory    PSHx:  Past Surgical History:  Procedure Laterality Date  . ablation     heart   Medications:  Medications Prior to Admission  Medication Sig Dispense Refill Last Dose  . cetirizine (ZYRTEC) 10 MG tablet Take 10 mg by mouth daily.   08/30/2019 at Unknown time  . omeprazole (PRILOSEC) 20 MG capsule Take 1 capsule (20 mg total) by mouth daily. 30  capsule 11 08/31/2019 at Unknown time  . Prenatal Vit-Fe Fumarate-FA (MULTIVITAMIN-PRENATAL) 27-0.8 MG TABS tablet Take 1 tablet by mouth daily at 12 noon.   08/30/2019 at Unknown time  . ursodiol (ACTIGALL) 300 MG capsule Take 300 mg by mouth 3 (three) times daily.    08/31/2019 at Unknown time   Allergies: is allergic to nutmeg oil (myristica oil) and penicillins. OBHx:  OB History  Gravida Para Term Preterm AB Living  1       0    SAB TAB Ectopic Multiple Live Births               # Outcome Date GA Lbr Len/2nd Weight Sex Delivery Anes PTL Lv  1 Current            PYK:DXIPJASN/KNLZJQBHALPF except as detailed in HPI.Marland Kitchen  No family history of birth defects. Soc Hx: Alcohol: none and Recreational drug use: none  Objective:   Vitals:   08/31/19 1945 08/31/19 2000  BP: 136/84 139/78  Pulse: 96 95   Constitutional: Well nourished, well developed female in no acute distress.  HEENT: normal Skin: Warm and dry.  Cardiovascular:Regular rate and rhythm.   Extremity: trace to 1+ bilateral pedal edema Respiratory: Clear to auscultation bilateral. Normal respiratory effort Abdomen: gravid, ND, FHT present, mild tenderness on exam Back: no CVAT Neuro: DTRs 2+, Cranial nerves grossly intact Psych: Alert and Oriented x3. No memory deficits. Normal mood and affect.  MS: normal gait, normal bilateral lower extremity ROM/strength/stability.  Pelvic exam: is not limited by body habitus EGBUS: within normal limits Vagina: within normal  limits and with normal mucosa Cervix: closed Uterus: No contractions observed for 45 minutes.  Adnexa: not evaluated  EFM:FHR: 140 bpm, variability: moderate,  accelerations:  Present,  decelerations:  Absent Toco: None  Results for orders placed or performed during the hospital encounter of 08/31/19  Urinalysis, Routine w reflex microscopic  Result Value Ref Range   Color, Urine YELLOW (A) YELLOW   APPearance HAZY (A) CLEAR   Specific Gravity, Urine 1.013  1.005 - 1.030   pH 6.0 5.0 - 8.0   Glucose, UA NEGATIVE NEGATIVE mg/dL   Hgb urine dipstick NEGATIVE NEGATIVE   Bilirubin Urine NEGATIVE NEGATIVE   Ketones, ur NEGATIVE NEGATIVE mg/dL   Protein, ur NEGATIVE NEGATIVE mg/dL   Nitrite NEGATIVE NEGATIVE   Leukocytes,Ua NEGATIVE NEGATIVE    Assessment & Plan:   29 y.o. G1P0000 @ [redacted]w[redacted]d, Admitted on 08/31/2019:Pain in upper abdomen.   Fluids, rest, f/u in office  No s/sx PTL  FWR  Barnett Applebaum, MD, [facility], [facility] 08/31/2019  8:41 PM   Procedures: A NST procedure was performed with FHR monitoring and a normal baseline established, appropriate time of 20-40 minutes of evaluation, and accels >2 seen w 15x15 characteristics.  Results show a REACTIVE NST.     Discharge Condition: good  Disposition: Discharge disposition: 01-Home or Self Care       Diet: Regular diet  Discharge Activity: Activity as tolerated  Discharge Instructions    Call MD for:   Complete by: As directed    Worsening contractions or pain; leakage of fluid; bleeding.   Diet general   Complete by: As directed    Increase activity slowly   Complete by: As directed      Allergies as of 08/31/2019      Reactions   Nutmeg Oil (myristica Oil) Anaphylaxis   Allergy to NUTS   Penicillins Anaphylaxis      Medication List    TAKE these medications   cetirizine 10 MG tablet Commonly known as: ZYRTEC Take 10 mg by mouth daily.   multivitamin-prenatal 27-0.8 MG Tabs tablet Take 1 tablet by mouth daily at 12 noon.   omeprazole 20 MG capsule Commonly known as: PRILOSEC Take 1 capsule (20 mg total) by mouth daily.   ursodiol 300 MG capsule Commonly known as: ACTIGALL Take 300 mg by mouth 3 (three) times daily.        Total time spent taking care of this patient: 30 minutes  Signed: Hoyt Koch 08/31/2019, 8:41 PM

## 2019-09-01 ENCOUNTER — Telehealth: Payer: Self-pay | Admitting: Obstetrics & Gynecology

## 2019-09-01 NOTE — Telephone Encounter (Signed)
Patient is scheduled for 09/03/19 for ultrasound and follow up

## 2019-09-01 NOTE — Telephone Encounter (Signed)
-----   Message from Gae Dry, MD sent at 08/31/2019  9:01 PM EDT ----- Regarding: resch OB US from 6/21 to this week anytime Can see any OB afterwards or can call results if no opening for OB

## 2019-09-03 ENCOUNTER — Ambulatory Visit (INDEPENDENT_AMBULATORY_CARE_PROVIDER_SITE_OTHER): Payer: BC Managed Care – PPO | Admitting: Obstetrics

## 2019-09-03 ENCOUNTER — Other Ambulatory Visit: Payer: Self-pay

## 2019-09-03 ENCOUNTER — Ambulatory Visit (INDEPENDENT_AMBULATORY_CARE_PROVIDER_SITE_OTHER): Payer: BC Managed Care – PPO

## 2019-09-03 VITALS — BP 134/86 | Wt 241.0 lb

## 2019-09-03 DIAGNOSIS — O26843 Uterine size-date discrepancy, third trimester: Secondary | ICD-10-CM

## 2019-09-03 DIAGNOSIS — O0990 Supervision of high risk pregnancy, unspecified, unspecified trimester: Secondary | ICD-10-CM

## 2019-09-03 DIAGNOSIS — K831 Obstruction of bile duct: Secondary | ICD-10-CM

## 2019-09-03 DIAGNOSIS — O26613 Liver and biliary tract disorders in pregnancy, third trimester: Secondary | ICD-10-CM

## 2019-09-03 DIAGNOSIS — Z3A33 33 weeks gestation of pregnancy: Secondary | ICD-10-CM

## 2019-09-03 DIAGNOSIS — Z6841 Body Mass Index (BMI) 40.0 and over, adult: Secondary | ICD-10-CM

## 2019-09-03 DIAGNOSIS — O26643 Intrahepatic cholestasis of pregnancy, third trimester: Secondary | ICD-10-CM

## 2019-09-03 NOTE — Progress Notes (Signed)
Routine Prenatal Care Visit  Subjective  Suzanne Guzman is a 29 y.o. G1P0000 at [redacted]w[redacted]d being seen today for ongoing prenatal care.  She is currently monitored for the following issues for this high-risk pregnancy and has Supervision of high risk pregnancy, antepartum; Obesity affecting pregnancy, antepartum; Labor and delivery indication for care or intervention; [redacted] weeks gestation of pregnancy; Gastroesophageal reflux disease; Pain of left calf; Cholestasis during pregnancy in third trimester; and Pregnancy related abdominal pain of lower quadrant, antepartum on their problem list.  ----------------------------------------------------------------------------------- Patient reports increased leg, ankle and pedal edema, as well as a new dx of cholestasis of pregnancy..  Had a growth scan ad AFI today as she measures S>D Contractions: Not present. Vag. Bleeding: None.  Movement: Present. Leaking Fluid denies.  ----------------------------------------------------------------------------------- The following portions of the patient's history were reviewed and updated as appropriate: allergies, current medications, past family history, past medical history, past social history, past surgical history and problem list. Problem list updated.  Objective  Blood pressure 134/86, weight 241 lb (109.3 kg), last menstrual period 01/14/2019. Pregravid weight 210 lb (95.3 kg) Total Weight Gain 31 lb (14.1 kg) Urinalysis: Urine Protein    Urine Glucose    Fetal Status:     Movement: Present     General:  Alert, oriented and cooperative. Patient is in no acute distress.  Skin: Skin is warm and dry. No rash noted.   Cardiovascular: Normal heart rate noted  Respiratory: Normal respiratory effort, no problems with respiration noted  Abdomen: Soft, gravid, appropriate for gestational age. Pain/Pressure: Absent     Pelvic:  Cervical exam deferred        Extremities: Normal range of motion.     Mental Status:  Normal mood and affect. Normal behavior. Normal judgment and thought content.   Assessment   29 y.o. G1P0000 at [redacted]w[redacted]d by  10/21/2019, by Last Menstrual Period presenting for routine prenatal visit Cholestasis of pregnancy High BMI  Plan   Pregnancy#2 Problems (from 01/14/19 to present)    Problem Noted Resolved   Supervision of high risk pregnancy, antepartum 03/13/2019 by Rod Can, CNM No   Overview Addendum 08/30/2019  9:07 PM by Dalia Heading, [place] Prenatal Labs  Dating By LMP c/w 6w @ [facility] Blood type: A/Positive/-- (01/19 1046)   Genetic Screen NIPS: normal XY Antibody:Negative (01/19 1046)  Anatomic [place] Normal Female Rubella: 1.79 (01/19 1046) Varicella: Immune  GTT Early: 116    28 wk: 125 RPR: Non Reactive (01/19 1046)   Rhogam N/A HBsAg: Negative (01/19 1046)   Vaccines TDAP:                       Flu Shot: HIV: Non Reactive (01/19 1046)   Baby Food Breast                               GBS:   Contraception POP Pap: 03/13/19:  CBB  No Hgb AA  CS/VBAC NA   Support Person Legrand Como          Previous Version   Obesity affecting pregnancy, antepartum 03/13/2019 by Rod Can, CNM No       Preterm labor symptoms and general obstetric precautions including but not limited to vaginal bleeding, contractions, leaking of fluid and fetal movement were reviewed in detail with the patient. Please refer to After Visit Summary for other counseling recommendations.  We also discussed cholestasis,  and printed information is downloaded into her MyChart. Her growth ultrasound is reviewed with her.  Return in about 2 days (around 09/05/2019) for  with MD please to discuss new dx of cholestasis and for lab draw and tdap.  Imagene Riches, CNM  09/03/2019 4:40 PM

## 2019-09-03 NOTE — Progress Notes (Signed)
No vb. No lof. U/s today. Still having itching, needs bile acids. Can come Friday morning for labs if needed (when fasting).

## 2019-09-03 NOTE — Patient Instructions (Signed)
Cholestasis of Pregnancy  Cholestasis refers to any condition that causes the flow of digestive fluid (bile) produced by the liver to slow down or stop. Cholestasis of pregnancy is most common toward the end of pregnancy (thirdtrimester), but it can occur any time during pregnancy. The condition often goes away soon after giving birth. Cholestasis may be uncomfortable, but it is usually harmless to you. However, it can be harmful to your baby. Cholestasis may increase the risk of:  Your baby being born too early (preterm delivery).  Your baby having a slow heart rate and lack of oxygen during delivery (fetal distress).  Losing your baby before delivery (stillbirth). What are the causes? The exact cause of this condition is not known, but it may be related to:  Pregnancy hormones. The gallbladder normally holds bile until you need it to help digest fat in your diet. Pregnancy hormones may cause the flow of bile to slow down and back up into your liver. Bile may then get into your bloodstream and cause cholestasis symptoms.  Changes in your genes (genetic mutations). Specifically, genes that affect how the liver releases bile. What increases the risk? You are more likely to develop this condition if:  You had cholestasis during a previous pregnancy.  You have a family history of cholestasis.  You have liver problems.  You are having multiple babies, such as twins or triplets. What are the signs or symptoms? The most common symptom of this condition is intense itching (pruritus), especially on the palms of your hands and soles of your feet. The itching can spread to the rest of your body and is often worse at night. You will not usually have a rash. Other symptoms may include:  Feeling tired.  Pain in your upper right abdomen.  Dark-colored urine.  Light-colored stools.  Poor appetite.  Yellowish discoloration of your skin and the whites of your eyes (jaundice). How is this  diagnosed? This condition is diagnosed based on:  Your medical history.  A physical exam.  Blood tests. If you have an inherited risk for developing this condition, you may also have genetic testing. How is this treated? The goal of treatment is to make you comfortable and keep your baby safe. Your health care provider may:  Prescribe medicine to reduce bile acid in your bloodstream, relieve symptoms, and help keep your baby safe.  Give you vitamin K before delivery to prevent excessive bleeding.  Check your baby frequently (fetal monitoring).  Perform regular blood tests to check your bile levels and liver function until your baby is delivered.  Recommend starting (inducing) your labor and delivery by week 36 or 37 of pregnancy, or as soon as your baby's lungs have developed enough. Follow these instructions at home:  Take over-the-counter and prescription medicines only as told by your health care provider.  Take cool baths to soothe itchy skin.  Wear comfortable, loose-fitting, cotton clothing to reduce itching.  Keep your fingernails short to prevent skin irritation from scratching.  Keep all follow-up visits and prenatal visits as told by your health care provider. This is important. Contact a health care provider if:  Your symptoms get worse, even with treatment.  You develop pain in your right side.  You have unusual swelling in your abdomen, feet, ankles, or legs.  You have a fever.  You are more thirsty than usual. Get help right away if:  You go into early labor.  You have a headache that does not go away or   causes changes in vision.  You have nausea or you vomit.  You have severe pain in your abdomen or shoulders.  You have shortness of breath. Summary  Cholestasis of pregnancy is most common toward the end of pregnancy (thirdtrimester), but it can occur any time during your pregnancy.  The condition often goes away soon after your baby is  born.  The most common symptom of cholestasis of pregnancy is intense itching (pruritus), especially on the palms of your hands and soles of your feet.  This condition may be treated with medicine, frequent monitoring, or starting (inducing) labor and delivery by week 36 or 37 of pregnancy. This information is not intended to replace advice given to you by your health care provider. Make sure you discuss any questions you have with your health care provider. Document Revised: 06/27/2018 Document Reviewed: 02/19/2016 Elsevier Patient Education  2020 Elsevier Inc.  

## 2019-09-05 ENCOUNTER — Other Ambulatory Visit: Payer: Self-pay

## 2019-09-05 ENCOUNTER — Encounter: Payer: Self-pay | Admitting: Obstetrics and Gynecology

## 2019-09-05 ENCOUNTER — Ambulatory Visit (INDEPENDENT_AMBULATORY_CARE_PROVIDER_SITE_OTHER): Payer: BC Managed Care – PPO | Admitting: Obstetrics and Gynecology

## 2019-09-05 ENCOUNTER — Other Ambulatory Visit: Payer: BC Managed Care – PPO

## 2019-09-05 VITALS — BP 136/80 | Wt 242.0 lb

## 2019-09-05 DIAGNOSIS — K831 Obstruction of bile duct: Secondary | ICD-10-CM | POA: Diagnosis not present

## 2019-09-05 DIAGNOSIS — Z23 Encounter for immunization: Secondary | ICD-10-CM | POA: Diagnosis not present

## 2019-09-05 DIAGNOSIS — O26613 Liver and biliary tract disorders in pregnancy, third trimester: Secondary | ICD-10-CM | POA: Diagnosis not present

## 2019-09-05 DIAGNOSIS — O99210 Obesity complicating pregnancy, unspecified trimester: Secondary | ICD-10-CM

## 2019-09-05 DIAGNOSIS — O0990 Supervision of high risk pregnancy, unspecified, unspecified trimester: Secondary | ICD-10-CM | POA: Diagnosis not present

## 2019-09-05 DIAGNOSIS — Z3A33 33 weeks gestation of pregnancy: Secondary | ICD-10-CM

## 2019-09-05 NOTE — Addendum Note (Signed)
Addended by: Brien Few on: 09/05/2019 11:45 AM   Modules accepted: Orders

## 2019-09-05 NOTE — Progress Notes (Signed)
Routine Prenatal Care Visit  Subjective  Suzanne Guzman is a 29 y.o. G1P0000 at [redacted]w[redacted]d being seen today for ongoing prenatal care.  She is currently monitored for the following issues for this high-risk pregnancy and has Supervision of high risk pregnancy, antepartum; Obesity affecting pregnancy, antepartum; Labor and delivery indication for care or intervention; [redacted] weeks gestation of pregnancy; Gastroesophageal reflux disease; Pain of left calf; Cholestasis during pregnancy in third trimester; and Pregnancy related abdominal pain of lower quadrant, antepartum on their problem list.  ----------------------------------------------------------------------------------- Patient reports still itching, but improved.   Contractions: Not present. Vag. Bleeding: None.  Movement: Present. Leaking Fluid denies.  Denies HA, visual changes, and RUQ pain. She has questions regarding ICP.  ----------------------------------------------------------------------------------- The following portions of the patient's history were reviewed and updated as appropriate: allergies, current medications, past family history, past medical history, past social history, past surgical history and problem list. Problem list updated.  Objective  Blood pressure 136/80, weight 242 lb (109.8 kg), last menstrual period 01/14/2019. Pregravid weight 210 lb (95.3 kg) Total Weight Gain 32 lb (14.5 kg) Urinalysis: Urine Protein    Urine Glucose    Fetal Status: Fetal Heart Rate (bpm): 140 Fundal Height: 38 cm Movement: Present     General:  Alert, oriented and cooperative. Patient is in no acute distress.  Skin: Skin is warm and dry. No rash noted.   Cardiovascular: Normal heart rate noted  Respiratory: Normal respiratory effort, no problems with respiration noted  Abdomen: Soft, gravid, appropriate for gestational age. Pain/Pressure: Absent     Pelvic:  Cervical exam deferred        Extremities: Normal range of motion.  Edema:  Mild pitting, slight indentation  Mental Status: Normal mood and affect. Normal behavior. Normal judgment and thought content.   Assessment   29 y.o. G1P0000 at [redacted]w[redacted]d by  10/21/2019, by Last Menstrual Period presenting for routine prenatal visit  Plan   Pregnancy#2 Problems (from 01/14/19 to present)    Problem Noted Resolved   Supervision of high risk pregnancy, antepartum 03/13/2019 by Rod Can, CNM No   Overview Addendum 09/03/2019  4:48 PM by Imagene Riches, [place] Prenatal Labs  Dating By LMP c/w 6w @ [facility] Blood type: A/Positive/-- (01/19 1046)   Genetic Screen NIPS: normal XY Antibody:Negative (01/19 1046)  Anatomic [place] Normal Female Rubella: 1.79 (01/19 1046) Varicella: Immune  GTT Early: 116    28 wk: 125 RPR: Non Reactive (01/19 1046)   Rhogam N/A HBsAg: Negative (01/19 1046)   Vaccines TDAP:    Set for 6/18                   Flu Shot: HIV: Non Reactive (01/19 1046)   Baby Food Breast                               GBS:   Contraception POP Pap: 03/13/19:  CBB  No Hgb AA  CS/VBAC NA   Support Person Legrand Como          Previous Version   Obesity affecting pregnancy, antepartum 03/13/2019 by Rod Can, CNM No       Preterm labor symptoms and general obstetric precautions including but not limited to vaginal bleeding, contractions, leaking of fluid and fetal movement were reviewed in detail with the patient. Please refer to After Visit Summary for other counseling recommendations.   - bile acids today. Long conversation  about natural course of ICP and likely need for early IOL.  Return in about 1 week (around 09/12/2019) for Routine Prenatal Appointment/NST.  Prentice Docker, MD, [facility], [facility] 09/05/2019 10:21 AM

## 2019-09-07 LAB — HEPATIC FUNCTION PANEL
ALT: 11 IU/L (ref 0–32)
AST: 17 IU/L (ref 0–40)
Albumin: 3.7 g/dL — ABNORMAL LOW (ref 3.9–5.0)
Alkaline Phosphatase: 158 IU/L — ABNORMAL HIGH (ref 48–121)
Bilirubin Total: <0.2 mg/dL (ref 0.0–1.2)
Bilirubin, Direct: 0.05 mg/dL (ref 0.00–0.40)
Total Protein: 6.4 g/dL (ref 6.0–8.5)

## 2019-09-07 LAB — BILE ACIDS, TOTAL: Bile Acids Total: 2.8 umol/L (ref 0.0–10.0)

## 2019-09-08 ENCOUNTER — Encounter: Payer: BC Managed Care – PPO | Admitting: Obstetrics and Gynecology

## 2019-09-09 ENCOUNTER — Other Ambulatory Visit: Payer: Self-pay | Admitting: Obstetrics

## 2019-09-09 DIAGNOSIS — K831 Obstruction of bile duct: Secondary | ICD-10-CM

## 2019-09-09 DIAGNOSIS — Z3A34 34 weeks gestation of pregnancy: Secondary | ICD-10-CM

## 2019-09-09 DIAGNOSIS — O26613 Liver and biliary tract disorders in pregnancy, third trimester: Secondary | ICD-10-CM

## 2019-09-09 MED ORDER — URSODIOL 500 MG PO TABS: 500.0000 mg | ORAL_TABLET | Freq: Two times a day (BID) | ORAL | 3 refills | Status: DC

## 2019-09-09 NOTE — Telephone Encounter (Signed)
Patient f/u on my chart message. States she is taking the medicine as prescribed and her itching has gotten a lot worse. Inquiring if there is anything else she can do to help the itching. FP#[phone]

## 2019-09-12 ENCOUNTER — Inpatient Hospital Stay
Admit: 2019-09-12 | Discharge: 2019-09-19 | DRG: 786 | Disposition: A | Discharge status: Home / Self Care | Payer: BC Managed Care – PPO | Attending: Obstetrics and Gynecology | Admitting: Obstetrics and Gynecology

## 2019-09-12 ENCOUNTER — Other Ambulatory Visit: Payer: Self-pay

## 2019-09-12 ENCOUNTER — Encounter: Payer: Self-pay | Admitting: Obstetrics and Gynecology

## 2019-09-12 ENCOUNTER — Ambulatory Visit (INDEPENDENT_AMBULATORY_CARE_PROVIDER_SITE_OTHER): Payer: BC Managed Care – PPO | Admitting: Obstetrics

## 2019-09-12 VITALS — BP 170/100 | Wt 245.0 lb

## 2019-09-12 DIAGNOSIS — G8918 Other acute postprocedural pain: Secondary | ICD-10-CM | POA: Diagnosis not present

## 2019-09-12 DIAGNOSIS — Z20822 Contact with and (suspected) exposure to covid-19: Secondary | ICD-10-CM | POA: Diagnosis not present

## 2019-09-12 DIAGNOSIS — J9600 Acute respiratory failure, unspecified whether with hypoxia or hypercapnia: Secondary | ICD-10-CM | POA: Diagnosis not present

## 2019-09-12 DIAGNOSIS — Z880 Allergy status to penicillin: Secondary | ICD-10-CM | POA: Diagnosis not present

## 2019-09-12 DIAGNOSIS — O1493 Unspecified pre-eclampsia, third trimester: Secondary | ICD-10-CM | POA: Diagnosis not present

## 2019-09-12 DIAGNOSIS — R569 Unspecified convulsions: Secondary | ICD-10-CM

## 2019-09-12 DIAGNOSIS — Z3A34 34 weeks gestation of pregnancy: Secondary | ICD-10-CM | POA: Diagnosis not present

## 2019-09-12 DIAGNOSIS — O0993 Supervision of high risk pregnancy, unspecified, third trimester: Secondary | ICD-10-CM

## 2019-09-12 DIAGNOSIS — O2662 Liver and biliary tract disorders in childbirth: Secondary | ICD-10-CM | POA: Diagnosis present

## 2019-09-12 DIAGNOSIS — O99214 Obesity complicating childbirth: Secondary | ICD-10-CM | POA: Diagnosis present

## 2019-09-12 DIAGNOSIS — K831 Obstruction of bile duct: Secondary | ICD-10-CM | POA: Diagnosis present

## 2019-09-12 DIAGNOSIS — D62 Acute posthemorrhagic anemia: Secondary | ICD-10-CM | POA: Diagnosis not present

## 2019-09-12 DIAGNOSIS — O151 Eclampsia in labor: Secondary | ICD-10-CM | POA: Diagnosis not present

## 2019-09-12 DIAGNOSIS — R030 Elevated blood-pressure reading, without diagnosis of hypertension: Secondary | ICD-10-CM | POA: Diagnosis present

## 2019-09-12 DIAGNOSIS — O0990 Supervision of high risk pregnancy, unspecified, unspecified trimester: Secondary | ICD-10-CM

## 2019-09-12 DIAGNOSIS — R079 Chest pain, unspecified: Secondary | ICD-10-CM | POA: Diagnosis not present

## 2019-09-12 DIAGNOSIS — J9601 Acute respiratory failure with hypoxia: Secondary | ICD-10-CM | POA: Diagnosis not present

## 2019-09-12 DIAGNOSIS — O1414 Severe pre-eclampsia complicating childbirth: Principal | ICD-10-CM | POA: Diagnosis present

## 2019-09-12 DIAGNOSIS — O9081 Anemia of the puerperium: Secondary | ICD-10-CM | POA: Diagnosis not present

## 2019-09-12 DIAGNOSIS — O99210 Obesity complicating pregnancy, unspecified trimester: Secondary | ICD-10-CM

## 2019-09-12 DIAGNOSIS — I456 Pre-excitation syndrome: Secondary | ICD-10-CM | POA: Diagnosis present

## 2019-09-12 DIAGNOSIS — O99824 Streptococcus B carrier state complicating childbirth: Secondary | ICD-10-CM | POA: Diagnosis present

## 2019-09-12 DIAGNOSIS — F419 Anxiety disorder, unspecified: Secondary | ICD-10-CM | POA: Diagnosis not present

## 2019-09-12 DIAGNOSIS — O9952 Diseases of the respiratory system complicating childbirth: Secondary | ICD-10-CM | POA: Diagnosis not present

## 2019-09-12 DIAGNOSIS — O163 Unspecified maternal hypertension, third trimester: Secondary | ICD-10-CM | POA: Diagnosis present

## 2019-09-12 DIAGNOSIS — R0603 Acute respiratory distress: Secondary | ICD-10-CM | POA: Diagnosis not present

## 2019-09-12 DIAGNOSIS — O99344 Other mental disorders complicating childbirth: Secondary | ICD-10-CM | POA: Diagnosis not present

## 2019-09-12 DIAGNOSIS — Z23 Encounter for immunization: Secondary | ICD-10-CM | POA: Diagnosis not present

## 2019-09-12 DIAGNOSIS — H5789 Other specified disorders of eye and adnexa: Secondary | ICD-10-CM | POA: Diagnosis not present

## 2019-09-12 DIAGNOSIS — J9690 Respiratory failure, unspecified, unspecified whether with hypoxia or hypercapnia: Secondary | ICD-10-CM | POA: Diagnosis not present

## 2019-09-12 HISTORY — DX: Obesity, unspecified: E66.9

## 2019-09-12 HISTORY — DX: Obstruction of bile duct: K83.1

## 2019-09-12 HISTORY — DX: Intrahepatic cholestasis of pregnancy, third trimester: O26.643

## 2019-09-12 HISTORY — DX: Liver and biliary tract disorders in pregnancy, third trimester: O26.613

## 2019-09-12 HISTORY — DX: Anxiety disorder, unspecified: F41.9

## 2019-09-12 HISTORY — DX: Pre-excitation syndrome: I45.6

## 2019-09-12 LAB — TYPE AND SCREEN
ABO/RH(D): A POS
Antibody Screen: NEGATIVE

## 2019-09-12 LAB — POCT URINALYSIS DIPSTICK OB
Glucose, UA: NEGATIVE
POC,PROTEIN,UA: NEGATIVE

## 2019-09-12 LAB — CBC
HCT: 29.9 % — ABNORMAL LOW (ref 36.0–46.0)
Hemoglobin: 10.1 g/dL — ABNORMAL LOW (ref 12.0–15.0)
MCH: 27.6 pg (ref 26.0–34.0)
MCHC: 33.8 g/dL (ref 30.0–36.0)
MCV: 81.7 fL (ref 80.0–100.0)
Platelets: 269 10*3/uL (ref 150–400)
RBC: 3.66 MIL/uL — ABNORMAL LOW (ref 3.87–5.11)
RDW: 12.6 % (ref 11.5–15.5)
WBC: 8.3 10*3/uL (ref 4.0–10.5)
nRBC: 0.2 % (ref 0.0–0.2)

## 2019-09-12 LAB — COMPREHENSIVE METABOLIC PANEL
ALT: 14 U/L (ref 0–44)
AST: 20 U/L (ref 15–41)
Albumin: 3.2 g/dL — ABNORMAL LOW (ref 3.5–5.0)
Alkaline Phosphatase: 147 U/L — ABNORMAL HIGH (ref 38–126)
Anion gap: 8 (ref 5–15)
BUN: 5 mg/dL — ABNORMAL LOW (ref 6–20)
CO2: 21 mmol/L — ABNORMAL LOW (ref 22–32)
Calcium: 9.1 mg/dL (ref 8.9–10.3)
Chloride: 108 mmol/L (ref 98–111)
Creatinine, Ser: 0.71 mg/dL (ref 0.44–1.00)
GFR calc Af Amer: >60 mL/min (ref >60)
GFR calc non Af Amer: >60 mL/min (ref >60)
Glucose, Bld: 103 mg/dL — ABNORMAL HIGH (ref 70–99)
Potassium: 3.7 mmol/L (ref 3.5–5.1)
Sodium: 137 mmol/L (ref 135–145)
Total Bilirubin: 0.4 mg/dL (ref 0.3–1.2)
Total Protein: 7 g/dL (ref 6.5–8.1)

## 2019-09-12 LAB — PROTEIN / CREATININE RATIO, URINE
Creatinine, Urine: 27 mg/dL
Protein Creatinine Ratio: 0.7 mg/mg{Cre} — ABNORMAL HIGH (ref 0.00–0.15)
Total Protein, Urine: 19 mg/dL

## 2019-09-12 LAB — ABO/RH: ABO/RH(D): A POS

## 2019-09-12 LAB — GROUP B STREP BY PCR: Group B strep by PCR: POSITIVE — AB

## 2019-09-12 LAB — SARS CORONAVIRUS 2 BY RT PCR (HOSPITAL ORDER, PERFORMED IN ~~LOC~~ HOSPITAL LAB): SARS Coronavirus 2: NEGATIVE

## 2019-09-12 MED ORDER — OXYTOCIN 10 UNIT/ML IJ SOLN
INTRAMUSCULAR | Status: AC
  Filled 2019-09-12: qty 2

## 2019-09-12 MED ORDER — MISOPROSTOL 25 MCG QUARTER TABLET
25.0000 ug | ORAL_TABLET | ORAL | Status: DC | PRN
  Administered 2019-09-13 – 2019-09-14 (×5): 25 ug via VAGINAL
  Filled 2019-09-12 (×5): qty 1

## 2019-09-12 MED ORDER — URSODIOL 500 MG PO TABS: 500.0000 mg | ORAL_TABLET | Freq: Two times a day (BID) | ORAL | Status: DC

## 2019-09-12 MED ORDER — LIDOCAINE HCL (PF) 1 % IJ SOLN: 30.0000 mL | INTRAMUSCULAR | Status: DC | PRN

## 2019-09-12 MED ORDER — NIFEDIPINE 10 MG PO CAPS: 10.0000 mg | ORAL_CAPSULE | Freq: Once | ORAL | Status: AC

## 2019-09-12 MED ORDER — HYDROXYZINE HCL 25 MG PO TABS
25.0000 mg | ORAL_TABLET | Freq: Four times a day (QID) | ORAL | Status: DC | PRN
  Administered 2019-09-12: 25 mg via ORAL
  Filled 2019-09-12 (×3): qty 1

## 2019-09-12 MED ORDER — BETAMETHASONE SOD PHOS & ACET 6 (3-3) MG/ML IJ SUSP
INTRAMUSCULAR | Status: AC
  Administered 2019-09-12: 12 mg via INTRAMUSCULAR
  Filled 2019-09-12: qty 5

## 2019-09-12 MED ORDER — LACTATED RINGERS IV SOLN: INTRAVENOUS | Status: DC

## 2019-09-12 MED ORDER — BUTORPHANOL TARTRATE 1 MG/ML IJ SOLN
2.0000 mg | INTRAMUSCULAR | Status: DC | PRN
  Administered 2019-09-13: 1 mg via INTRAVENOUS
  Filled 2019-09-12: qty 2

## 2019-09-12 MED ORDER — MAGNESIUM SULFATE 40 GM/1000ML IV SOLN
2.0000 g/h | INTRAVENOUS | Status: DC
  Administered 2019-09-13 – 2019-09-15 (×3): 2 g/h via INTRAVENOUS
  Filled 2019-09-12 (×4): qty 1000

## 2019-09-12 MED ORDER — MISOPROSTOL 200 MCG PO TABS
ORAL_TABLET | ORAL | Status: AC
  Filled 2019-09-12: qty 4

## 2019-09-12 MED ORDER — LABETALOL HCL 5 MG/ML IV SOLN
20.0000 mg | INTRAVENOUS | Status: DC | PRN
  Administered 2019-09-12 – 2019-09-17 (×6): 20 mg via INTRAVENOUS
  Filled 2019-09-12 (×6): qty 4

## 2019-09-12 MED ORDER — PRENATAL MULTIVITAMIN CH: 1.0000 | ORAL_TABLET | Freq: Every day | ORAL | Status: DC

## 2019-09-12 MED ORDER — CALCIUM GLUCONATE 10 % IV SOLN
INTRAVENOUS | Status: AC
  Filled 2019-09-12: qty 10

## 2019-09-12 MED ORDER — HYDRALAZINE HCL 20 MG/ML IJ SOLN
10.0000 mg | INTRAMUSCULAR | Status: DC | PRN
  Administered 2019-09-12: 10 mg via INTRAVENOUS
  Filled 2019-09-12: qty 1

## 2019-09-12 MED ORDER — LABETALOL HCL 5 MG/ML IV SOLN
40.0000 mg | INTRAVENOUS | Status: DC | PRN
  Administered 2019-09-12 – 2019-09-16 (×3): 40 mg via INTRAVENOUS
  Filled 2019-09-12 (×4): qty 8

## 2019-09-12 MED ORDER — BETAMETHASONE SOD PHOS & ACET 6 (3-3) MG/ML IJ SUSP: 12.0000 mg | Freq: Once | INTRAMUSCULAR | Status: AC

## 2019-09-12 MED ORDER — LABETALOL HCL 5 MG/ML IV SOLN
80.0000 mg | INTRAVENOUS | Status: DC | PRN
  Administered 2019-09-12 – 2019-09-16 (×2): 80 mg via INTRAVENOUS
  Filled 2019-09-12: qty 16

## 2019-09-12 MED ORDER — OXYTOCIN-SODIUM CHLORIDE 30-0.9 UT/500ML-% IV SOLN
INTRAVENOUS | Status: AC
  Filled 2019-09-12: qty 500

## 2019-09-12 MED ORDER — LACTATED RINGERS IV SOLN: 500.0000 mL | INTRAVENOUS | Status: DC | PRN

## 2019-09-12 MED ORDER — PANTOPRAZOLE SODIUM 40 MG PO TBEC
40.0000 mg | DELAYED_RELEASE_TABLET | Freq: Every day | ORAL | Status: DC
  Administered 2019-09-13 – 2019-09-14 (×2): 40 mg via ORAL
  Filled 2019-09-12 (×2): qty 1

## 2019-09-12 MED ORDER — VANCOMYCIN HCL IN DEXTROSE 1-5 GM/200ML-% IV SOLN
1000.0000 mg | Freq: Two times a day (BID) | INTRAVENOUS | Status: DC
  Filled 2019-09-12 (×4): qty 200

## 2019-09-12 MED ORDER — MISOPROSTOL 25 MCG QUARTER TABLET
ORAL_TABLET | ORAL | Status: AC
  Administered 2019-09-12: 25 ug
  Filled 2019-09-12: qty 1

## 2019-09-12 MED ORDER — MAGNESIUM SULFATE BOLUS VIA INFUSION
4.0000 g | Freq: Once | INTRAVENOUS | Status: AC
  Administered 2019-09-12: 4 g via INTRAVENOUS
  Filled 2019-09-12: qty 1000

## 2019-09-12 MED ORDER — SOD CITRATE-CITRIC ACID 500-334 MG/5ML PO SOLN
30.0000 mL | ORAL | Status: DC | PRN
  Filled 2019-09-12: qty 30

## 2019-09-12 MED ORDER — ONDANSETRON HCL 4 MG/2ML IJ SOLN: 4.0000 mg | Freq: Four times a day (QID) | INTRAMUSCULAR | Status: DC | PRN

## 2019-09-12 MED ORDER — AMLODIPINE BESYLATE 10 MG PO TABS
10.0000 mg | ORAL_TABLET | Freq: Every day | ORAL | Status: DC
  Administered 2019-09-12 – 2019-09-17 (×6): 10 mg via ORAL
  Filled 2019-09-12 (×7): qty 1

## 2019-09-12 MED ORDER — AMMONIA AROMATIC IN INHA
RESPIRATORY_TRACT | Status: AC
  Filled 2019-09-12: qty 10

## 2019-09-12 MED ORDER — ACETAMINOPHEN 325 MG PO TABS
650.0000 mg | ORAL_TABLET | ORAL | Status: DC | PRN
  Administered 2019-09-12 – 2019-09-13 (×2): 650 mg via ORAL
  Filled 2019-09-12 (×2): qty 2

## 2019-09-12 MED ORDER — OXYTOCIN-SODIUM CHLORIDE 30-0.9 UT/500ML-% IV SOLN
2.5000 [IU]/h | INTRAVENOUS | Status: DC
  Administered 2019-09-14: 2.5 [IU]/h via INTRAVENOUS
  Administered 2019-09-14: 30 [IU]/h via INTRAVENOUS
  Filled 2019-09-12: qty 500

## 2019-09-12 MED ORDER — URSODIOL 300 MG PO CAPS
300.0000 mg | ORAL_CAPSULE | Freq: Three times a day (TID) | ORAL | Status: DC
  Administered 2019-09-12 – 2019-09-16 (×10): 300 mg via ORAL
  Filled 2019-09-12 (×14): qty 1

## 2019-09-12 MED ORDER — OXYTOCIN BOLUS FROM INFUSION: 333.0000 mL | Freq: Once | INTRAVENOUS | Status: DC

## 2019-09-12 MED ORDER — OXYTOCIN-SODIUM CHLORIDE 30-0.9 UT/500ML-% IV SOLN
1.0000 m[IU]/min | INTRAVENOUS | Status: DC
  Administered 2019-09-14: 1 m[IU]/min via INTRAVENOUS

## 2019-09-12 MED ORDER — TERBUTALINE SULFATE 1 MG/ML IJ SOLN: 0.2500 mg | Freq: Once | INTRAMUSCULAR | Status: DC | PRN

## 2019-09-12 MED ORDER — LIDOCAINE HCL (PF) 1 % IJ SOLN
INTRAMUSCULAR | Status: AC
  Filled 2019-09-12: qty 30

## 2019-09-12 MED ORDER — NIFEDIPINE 10 MG PO CAPS
ORAL_CAPSULE | ORAL | Status: AC
  Administered 2019-09-12: 10 mg via ORAL
  Filled 2019-09-12: qty 1

## 2019-09-12 MED ORDER — LORATADINE 10 MG PO TABS
10.0000 mg | ORAL_TABLET | Freq: Every day | ORAL | Status: DC
  Administered 2019-09-13 – 2019-09-14 (×2): 10 mg via ORAL
  Filled 2019-09-12 (×2): qty 1

## 2019-09-12 NOTE — Consult Note (Signed)
  30 + Weeks  Neonatology Consult  Note:  At the request of the patients obstetrician Dr. Gilman Schmidt  I met with Mr and Mrs Douds who is at  1 3/7 wks currently with pregnancy complicated by pre-eclampsia, IOL. Has received 1 dose of betamethasone with second dose scheduled for tomorrow  We reviewed initial delivery room management, including CPAP, [place], and low but certainly possible need for intubation for surfactant administration.  We discussed feeding immaturity and need for full po intake with multiple days of good weight gain and no apnea or bradycardia before discharge.  We reviewed increased risk of jaundice, infection, and temperature instability.   Discussed likely length of stay.  Thank you for allowing [place] to participate in her care.  Please call with questions. Neil Crouch NNP-BC  Neonatal Nurse Practitioner   The total length of face-to-face or floor / unit time for this encounter was 30 minutes.  Counseling and / or coordination of care was greater than fifty percent of the time.

## 2019-09-12 NOTE — Progress Notes (Signed)
[place] present for a "ROB appt. Her BP is noticeably elevated today.  She reports an ongoing dull head ache today.Her baby is moving well. NST reactive in the office. She denies contractions, LOF or vaginal bleeding. She has had some increased discharge. O: Spec exa ; leukkorhea of preg noted. No ROM on visual exam.      BP repeated: 172/100      Some leg and ankle edema noted. A: IUP 34 w3 days      Elevated BPs      cholestasis of pregnancy- recently increased dose of Actigall to 500 mg po.       Reactive NSTs P: Discussed her elevated Bps. She will proceed to the hospital for [facility] and serial Bps and labs.      CGgutierrez notified re patient's need for [facility] evaluation.      RTC in 1 week for ROB follow.      Briefly discussed the IOL at 37 weeks for cholestasis.  Imagene Riches, CNM  09/12/2019 2:35 PM

## 2019-09-12 NOTE — Progress Notes (Signed)
SVE: closed, long, high Cytotec placed Discussed IOL, pain management options, and labor expectations in detail   Adrian Prows MD, [place], [facility] 09/12/2019 6:59 PM

## 2019-09-12 NOTE — H&P (Signed)
OB History & Physical   History of Present Illness:  Chief Complaint:  Sent from office with elevated blood pressures and a headache HPI:  Suzanne Guzman is a 29 y.o. G92P0000 Black female with EDC=10/21/2019 at [redacted]w[redacted]d dated by LMP=6wk ultrasound.  Her pregnancy has been complicated by obesity (current BMI=43.40 kg/m2) and cholestatisis (diagnosed earlier this month at [facility] when she presented with pruritis). She is currently taking actigall 300 mgm tid. She presented for a ROB and NST with complaints of headache behind her right eye and scotomata since yesterday. Her blood pressure was 172/100 and she was sent to L&D for further evaluation. Did have an elevated blood pressure at NO[facility] of 145/88.   She reports having sudden increase in swelling in lower extremities yesterday and stayed home from work to elevate legs. Has some LUQ pain and mild nausea. Baby has been active. No vaginal bleeding. Has noticed an occasional contraction and increased vaginal discharge.  EFW on ultrasound 6/16 was 5#14oz (2651 gm) with normal AFI  Prenatal care site: Prenatal care at [facility] has been remarkable for a 35# weight gain and for the following:  Clinic [facility] Prenatal Labs  Dating By LMP c/w 6w @ [facility] Blood type: A/Positive/-- (01/19 1046)   Genetic Screen NIPS: normal XY Antibody:Negative (01/19 1046)  Anatomic [place] Normal Female Rubella: 1.79 (01/19 1046) Varicella: Immune  GTT Early: 116    28 wk: 125 RPR: Non Reactive (01/19 1046)   Rhogam N/A HBsAg: Negative (01/19 1046)   Vaccines TDAP: 09/05/19                   Flu Shot: HIV: Non Reactive (01/19 1046)   Baby Food Breast                               GBS:   Contraception POP Pap: 03/13/19:  CBB  No Hgb AA  CS/VBAC NA   Support Person Suzanne Guzman        Maternal Medical History:   Past Medical History:  Diagnosis Date  . Anxiety   . Cholestasis during pregnancy in third trimester   . Obesity   . Wolff-Parkinson-White syndrome    treated  with ablation    Past Surgical History:  Procedure Laterality Date  . CARDIAC ELECTROPHYSIOLOGY MAPPING AND ABLATION     heart    Allergies  Allergen Reactions  . Nutmeg Oil (Myristica Oil) Anaphylaxis    Allergy to NUTS  . Penicillins Anaphylaxis    Prior to Admission medications   Medication Sig Start Date End Date Taking? Authorizing Provider  cetirizine (ZYRTEC) 10 MG tablet Take 10 mg by mouth daily.   Yes [provider]  omeprazole (PRILOSEC) 20 MG capsule Take 1 capsule (20 mg total) by mouth daily. 07/06/19  Yes Malachy Mood, MD  Prenatal Vit-Fe Fumarate-FA (MULTIVITAMIN-PRENATAL) 27-0.8 MG TABS tablet Take 1 tablet by mouth daily at 12 noon.   Yes [provider]  ursodiol (ACTIGALL) 500 MG tablet Take 1 tablet (500 mg total) by mouth 2 (two) times daily. 09/09/19  Yes Imagene Riches, CNM      Social History: She  reports that she has never smoked. She has never used smokeless tobacco. She reports previous alcohol use. She reports that she does not use drugs.  Family History: family history includes Cancer in her mother; Other in her father.   Review of Systems: Negative x 10 systems reviewed except  as noted in the HPI.      Physical Exam:  Vital Signs: 184/110, then 167/113  General: very anxious, tearful, no acute physical distress.  HEENT: normocephalic, atraumatic Heart: regular rate & rhythm.  No murmurs/rubs/gallops Lungs: clear to auscultation bilaterally Abdomen: soft, gravid, non-tender;  EFW: 6# Pelvic:  Extremities: non-tender, symmetric, +2 edema bilaterally.  DTRs Neurologic: Alert & oriented x 3 Psyche: +anxiety  Baseline FHR: 140 baseline with accelerations to 160s to 170s, moderate variability Toco: irregular mild contractions  Bedside Ultrasound: ROA/ anterior placenta   Assessment:  Suzanne Guzman is a 29 y.o. G1P0000 female at [redacted]w[redacted]d with severe preeclampsia.  FWB: Cat1 tracing Plan:  1. Admit to Labor &  Delivery -notified Dr Gilman Schmidt 2. CBC, T&S, CMP, PC ratio 3. GBS PCR -if positive, start Vancomycin for PPX 4. Given Procardia 10 mgm po while accessing IV (IV team called), then labetalol and hydralazine protocol for treatment of severe blood pressures. 5. Magnesium sulfate 4GM loading dose and 2 gm/hr 6. Betamethasone 12 mgm IM. Repeat second dose in 12 to 24 hours 7. Atarax for anxiety. Continue ursodiol for cholestasis 8. Discussed diagnosis of preeclampsia with patient and some of the possible complications/ risks of preeclampsia and the need for treatment of severe blood pressures and to give magnesium sulfate for seizure prevention. Also discussed moving toward delivery with induction. At that point patient began having a panic attack. Wanted to wait until she received atarax for anxiety before proceeding with COvid testing, cervical exam and GBS PCR.   Suzanne Guzman  09/12/2019 4:45 PM

## 2019-09-13 LAB — RPR: RPR Ser Ql: NONREACTIVE

## 2019-09-13 LAB — CBC
HCT: 28.9 % — ABNORMAL LOW (ref 36.0–46.0)
Hemoglobin: 10 g/dL — ABNORMAL LOW (ref 12.0–15.0)
MCH: 27.7 pg (ref 26.0–34.0)
MCHC: 34.6 g/dL (ref 30.0–36.0)
MCV: 80.1 fL (ref 80.0–100.0)
Platelets: 263 10*3/uL (ref 150–400)
RBC: 3.61 MIL/uL — ABNORMAL LOW (ref 3.87–5.11)
RDW: 13.1 % (ref 11.5–15.5)
WBC: 11.8 10*3/uL — ABNORMAL HIGH (ref 4.0–10.5)
nRBC: 0 % (ref 0.0–0.2)

## 2019-09-13 MED ORDER — VANCOMYCIN HCL 10 G IV SOLR
2000.0000 mg | Freq: Two times a day (BID) | INTRAVENOUS | Status: DC
  Administered 2019-09-15: 2000 mg via INTRAVENOUS
  Filled 2019-09-13 (×5): qty 2000

## 2019-09-13 MED ORDER — BETAMETHASONE SOD PHOS & ACET 6 (3-3) MG/ML IJ SUSP
12.0000 mg | Freq: Once | INTRAMUSCULAR | Status: AC
  Administered 2019-09-13: 12 mg via INTRAMUSCULAR

## 2019-09-13 MED ORDER — FENTANYL CITRATE (PF) 100 MCG/2ML IJ SOLN
INTRAMUSCULAR | Status: AC
  Filled 2019-09-13: qty 2

## 2019-09-13 MED ORDER — VANCOMYCIN HCL 10 G IV SOLR: 2000.0000 mg | Freq: Three times a day (TID) | INTRAVENOUS | Status: DC

## 2019-09-13 MED ORDER — VANCOMYCIN HCL 10 G IV SOLR
2000.0000 mg | Freq: Once | INTRAVENOUS | Status: AC
  Administered 2019-09-14: 2000 mg via INTRAVENOUS
  Filled 2019-09-13: qty 2000

## 2019-09-13 MED ORDER — FENTANYL CITRATE (PF) 100 MCG/2ML IJ SOLN
50.0000 ug | INTRAMUSCULAR | Status: DC | PRN
  Administered 2019-09-13 – 2019-09-14 (×3): 50 ug via INTRAVENOUS
  Filled 2019-09-13 (×2): qty 2

## 2019-09-13 NOTE — Progress Notes (Signed)
Labor Check  Subj:  Complaints: intermittent discomfort   Obj:  BP (!) 142/87   Pulse 92   Temp 97.9 F (36.6 C) (Oral)   Resp 16   Ht 5\' 3"  (1.6 m)   Wt 112.4 kg   LMP 01/14/2019 (Exact Date)   SpO2 98%   BMI 43.90 kg/m     Cervix: Dilation: Closed / Effacement (%): Thick / Station: Ballotable  Baseline FHR: 125 beats/min   Variability: moderate   Accelerations: present   Decelerations: absent Contractions: present frequency: 3 q 10 min Overall assessment: cat 1  A/P: 29 y.o. G1P0000 female at [redacted]w[redacted]d with IOL for preeclampsia with severe features.  1.  Labor: continue cervical ripening with misoprostol. Consider Foley balloon, if patient amenable.  Discussed process of induction again, discussed preeclampsia and why IOL is necessary. All questions answered.   2.  FWB: reactive, Overall assessment: category 1  3.  GBS positive: tx per protocol  4.  Pain: prn, epidural when ready 5. PPX: add SCDs 6.  Recheck: prn   Prentice Docker, MD, [place], [facility] 09/13/2019 1:30 PM

## 2019-09-13 NOTE — Progress Notes (Signed)
MD notified of late decels after placing cytotec that resolved with position change. Notified provider that patient has decided she doesn't want to do foley bulb at any point. Per MD can change fentanyl order back to stadol per pt request. Will place order for 2nd BMZ. Per MD hold off on starting GBS tx at this time.

## 2019-09-13 NOTE — Consult Note (Signed)
Pharmacy Antibiotic Note  Suzanne Guzman is a 29 y.o. female admitted on 09/12/2019. Pharmacy consulted for vancomycin dosing for GBS prophylaxis. Patient has a reported anaphylaxis allergy to PCN.   Plan: Vancomycin 2000mg  IV x 1 loading dose.   Using AUC calculator: Will start Vancomycin 2000 mg IV Q 12 hrs. Goal AUC 400-550. Expected AUC: 463 VD: 0.9  SCr used: 0.71 Css: 13.1    Height: 5\' 3"  (160 cm) Weight: 112.4 kg (247 lb 12.8 oz) IBW/kg (Calculated) : 52.4  Temp (24hrs), Avg:98 F (36.7 C), Min:97.9 F (36.6 C), Max:98.5 F (36.9 C)  Recent Labs  Lab 09/12/19 1521 09/13/19 0852  WBC 8.3 11.8*  CREATININE 0.71  --     Estimated Creatinine Clearance: 126.3 mL/min (by C-G formula based on SCr of 0.71 mg/dL).    Allergies  Allergen Reactions  . Nutmeg Oil (Myristica Oil) Anaphylaxis    Allergy to NUTS  . Penicillins Anaphylaxis    Antimicrobials this admission: 6/26 Vancomycin>>   Thank you for allowing pharmacy to be a part of this patient's care.  Pernell Dupre, PharmD, BCPS Clinical Pharmacist 09/13/2019 7:18 PM

## 2019-09-14 ENCOUNTER — Encounter: Payer: Self-pay | Admitting: Obstetrics and Gynecology

## 2019-09-14 ENCOUNTER — Encounter
Disposition: A | Discharge status: Home / Self Care | Payer: Self-pay | Source: Home / Self Care | Attending: Obstetrics and Gynecology

## 2019-09-14 ENCOUNTER — Inpatient Hospital Stay: Payer: BC Managed Care – PPO

## 2019-09-14 ENCOUNTER — Inpatient Hospital Stay: Payer: BC Managed Care – PPO | Admitting: Anesthesiology

## 2019-09-14 DIAGNOSIS — O99210 Obesity complicating pregnancy, unspecified trimester: Secondary | ICD-10-CM

## 2019-09-14 DIAGNOSIS — O1493 Unspecified pre-eclampsia, third trimester: Secondary | ICD-10-CM

## 2019-09-14 DIAGNOSIS — O0990 Supervision of high risk pregnancy, unspecified, unspecified trimester: Secondary | ICD-10-CM

## 2019-09-14 LAB — COMPREHENSIVE METABOLIC PANEL
ALT: 24 U/L (ref 0–44)
ALT: 26 U/L (ref 0–44)
AST: 28 U/L (ref 15–41)
AST: 30 U/L (ref 15–41)
Albumin: 3.3 g/dL — ABNORMAL LOW (ref 3.5–5.0)
Albumin: 3 g/dL — ABNORMAL LOW (ref 3.5–5.0)
Alkaline Phosphatase: 140 U/L — ABNORMAL HIGH (ref 38–126)
Alkaline Phosphatase: 131 U/L — ABNORMAL HIGH (ref 38–126)
Anion gap: 12 (ref 5–15)
Anion gap: 10 (ref 5–15)
BUN: 10 mg/dL (ref 6–20)
BUN: 10 mg/dL (ref 6–20)
CO2: 18 mmol/L — ABNORMAL LOW (ref 22–32)
CO2: 20 mmol/L — ABNORMAL LOW (ref 22–32)
Calcium: 7.6 mg/dL — ABNORMAL LOW (ref 8.9–10.3)
Calcium: 7.3 mg/dL — ABNORMAL LOW (ref 8.9–10.3)
Chloride: 104 mmol/L (ref 98–111)
Chloride: 105 mmol/L (ref 98–111)
Creatinine, Ser: 0.85 mg/dL (ref 0.44–1.00)
Creatinine, Ser: 0.8 mg/dL (ref 0.44–1.00)
GFR calc Af Amer: >60 mL/min (ref >60)
GFR calc Af Amer: >60 mL/min (ref >60)
GFR calc non Af Amer: >60 mL/min (ref >60)
GFR calc non Af Amer: >60 mL/min (ref >60)
Glucose, Bld: 109 mg/dL — ABNORMAL HIGH (ref 70–99)
Glucose, Bld: 122 mg/dL — ABNORMAL HIGH (ref 70–99)
Potassium: 4 mmol/L (ref 3.5–5.1)
Potassium: 3.8 mmol/L (ref 3.5–5.1)
Sodium: 134 mmol/L — ABNORMAL LOW (ref 135–145)
Sodium: 135 mmol/L (ref 135–145)
Total Bilirubin: 0.5 mg/dL (ref 0.3–1.2)
Total Bilirubin: 0.6 mg/dL (ref 0.3–1.2)
Total Protein: 6.9 g/dL (ref 6.5–8.1)
Total Protein: 6.4 g/dL — ABNORMAL LOW (ref 6.5–8.1)

## 2019-09-14 LAB — CBC
HCT: 29.1 % — ABNORMAL LOW (ref 36.0–46.0)
HCT: 29.1 % — ABNORMAL LOW (ref 36.0–46.0)
Hemoglobin: 9.9 g/dL — ABNORMAL LOW (ref 12.0–15.0)
Hemoglobin: 9.8 g/dL — ABNORMAL LOW (ref 12.0–15.0)
MCH: 27.8 pg (ref 26.0–34.0)
MCH: 27.1 pg (ref 26.0–34.0)
MCHC: 34 g/dL (ref 30.0–36.0)
MCHC: 33.7 g/dL (ref 30.0–36.0)
MCV: 81.7 fL (ref 80.0–100.0)
MCV: 80.6 fL (ref 80.0–100.0)
Platelets: 309 10*3/uL (ref 150–400)
Platelets: 279 10*3/uL (ref 150–400)
RBC: 3.56 MIL/uL — ABNORMAL LOW (ref 3.87–5.11)
RBC: 3.61 MIL/uL — ABNORMAL LOW (ref 3.87–5.11)
RDW: 13.2 % (ref 11.5–15.5)
RDW: 13.2 % (ref 11.5–15.5)
WBC: 12.4 10*3/uL — ABNORMAL HIGH (ref 4.0–10.5)
WBC: 12.4 10*3/uL — ABNORMAL HIGH (ref 4.0–10.5)
nRBC: 0 % (ref 0.0–0.2)
nRBC: 0 % (ref 0.0–0.2)

## 2019-09-14 LAB — MAGNESIUM
Magnesium: 5.8 mg/dL — ABNORMAL HIGH (ref 1.7–2.4)
Magnesium: 5.5 mg/dL — ABNORMAL HIGH (ref 1.7–2.4)

## 2019-09-14 LAB — PROTIME-INR
INR: 0.9 (ref 0.8–1.2)
Prothrombin Time: 11.9 seconds (ref 11.4–15.2)

## 2019-09-14 LAB — TROPONIN I (HIGH SENSITIVITY)
Troponin I (High Sensitivity): 29 ng/L — ABNORMAL HIGH (ref <18)
Troponin I (High Sensitivity): 23 ng/L — ABNORMAL HIGH (ref <18)

## 2019-09-14 LAB — FIBRINOGEN: Fibrinogen: 444 mg/dL (ref 210–475)

## 2019-09-14 LAB — LIPASE, BLOOD: Lipase: 20 U/L (ref 11–51)

## 2019-09-14 LAB — PHOSPHORUS: Phosphorus: 5.1 mg/dL — ABNORMAL HIGH (ref 2.5–4.6)

## 2019-09-14 LAB — GLUCOSE, CAPILLARY: Glucose-Capillary: 108 mg/dL — ABNORMAL HIGH (ref 70–99)

## 2019-09-14 LAB — AMYLASE: Amylase: 29 U/L (ref 28–100)

## 2019-09-14 SURGERY — Surgical Case
Anesthesia: General | Site: Abdomen

## 2019-09-14 MED ORDER — LIDOCAINE 2% (20 MG/ML) 5 ML SYRINGE
INTRAMUSCULAR | Status: DC | PRN
  Administered 2019-09-14 (×4): 100 mg via INTRAVENOUS

## 2019-09-14 MED ORDER — ACETAMINOPHEN 325 MG PO TABS
650.0000 mg | ORAL_TABLET | Freq: Four times a day (QID) | ORAL | Status: AC
  Administered 2019-09-14 – 2019-09-15 (×3): 650 mg via ORAL
  Filled 2019-09-14 (×4): qty 2

## 2019-09-14 MED ORDER — BUPIVACAINE HCL (PF) 0.5 % IJ SOLN: 5.0000 mL | Freq: Once | INTRAMUSCULAR | Status: DC

## 2019-09-14 MED ORDER — EPHEDRINE SULFATE-NACL 50-0.9 MG/10ML-% IV SOSY
PREFILLED_SYRINGE | INTRAVENOUS | Status: DC | PRN
  Administered 2019-09-14: 5 mg via INTRAVENOUS
  Administered 2019-09-14: 10 mg via INTRAVENOUS

## 2019-09-14 MED ORDER — EPHEDRINE 5 MG/ML INJ: 10.0000 mg | INTRAVENOUS | Status: DC | PRN

## 2019-09-14 MED ORDER — DIPHENHYDRAMINE HCL 50 MG/ML IJ SOLN: 12.5000 mg | INTRAMUSCULAR | Status: DC | PRN

## 2019-09-14 MED ORDER — ALUM & MAG HYDROXIDE-SIMETH 200-200-20 MG/5ML PO SUSP: 15.0000 mL | ORAL | Status: DC | PRN

## 2019-09-14 MED ORDER — CARBOPROST TROMETHAMINE 250 MCG/ML IM SOLN
INTRAMUSCULAR | Status: AC
  Filled 2019-09-14: qty 1

## 2019-09-14 MED ORDER — METHYLERGONOVINE MALEATE 0.2 MG/ML IJ SOLN
INTRAMUSCULAR | Status: AC
  Filled 2019-09-14: qty 1

## 2019-09-14 MED ORDER — PANTOPRAZOLE SODIUM 40 MG IV SOLR
40.0000 mg | INTRAVENOUS | Status: DC
  Administered 2019-09-14: 40 mg via INTRAVENOUS
  Filled 2019-09-14 (×2): qty 40

## 2019-09-14 MED ORDER — OXYCODONE HCL 5 MG PO TABS
5.0000 mg | ORAL_TABLET | Freq: Four times a day (QID) | ORAL | Status: AC | PRN
  Filled 2019-09-14 (×2): qty 1

## 2019-09-14 MED ORDER — LIDOCAINE HCL 2 % IJ SOLN
INTRAMUSCULAR | Status: AC
  Filled 2019-09-14: qty 10

## 2019-09-14 MED ORDER — ONDANSETRON HCL 4 MG/2ML IJ SOLN
INTRAMUSCULAR | Status: DC | PRN
  Administered 2019-09-14: 4 mg via INTRAVENOUS

## 2019-09-14 MED ORDER — NITROGLYCERIN 0.4 MG SL SUBL
0.4000 mg | SUBLINGUAL_TABLET | SUBLINGUAL | Status: DC | PRN
  Filled 2019-09-14: qty 1

## 2019-09-14 MED ORDER — MORPHINE SULFATE (PF) 0.5 MG/ML IJ SOLN
INTRAMUSCULAR | Status: DC | PRN
  Administered 2019-09-14: 100 ug via INTRATHECAL

## 2019-09-14 MED ORDER — NALBUPHINE HCL 10 MG/ML IJ SOLN
5.0000 mg | INTRAMUSCULAR | Status: DC | PRN
  Filled 2019-09-14: qty 0.5

## 2019-09-14 MED ORDER — OXYTOCIN-SODIUM CHLORIDE 30-0.9 UT/500ML-% IV SOLN
INTRAVENOUS | Status: AC
  Filled 2019-09-14: qty 500

## 2019-09-14 MED ORDER — FENTANYL 2.5 MCG/ML W/ROPIVACAINE 0.15% IN NS 100 ML EPIDURAL (ARMC)
12.0000 mL/h | EPIDURAL | Status: DC
  Administered 2019-09-14: 12 mL/h via EPIDURAL

## 2019-09-14 MED ORDER — BUPIVACAINE IN DEXTROSE 0.75-8.25 % IT SOLN
INTRATHECAL | Status: DC | PRN
  Administered 2019-09-14: 1.2 mL via INTRATHECAL

## 2019-09-14 MED ORDER — FENTANYL CITRATE (PF) 100 MCG/2ML IJ SOLN
INTRAMUSCULAR | Status: DC | PRN
  Administered 2019-09-14: 100 ug via EPIDURAL

## 2019-09-14 MED ORDER — BUPIVACAINE HCL (PF) 0.5 % IJ SOLN
INTRAMUSCULAR | Status: AC
  Filled 2019-09-14: qty 30

## 2019-09-14 MED ORDER — GENTAMICIN SULFATE 40 MG/ML IJ SOLN
5.0000 mg/kg | INTRAVENOUS | Status: AC
  Administered 2019-09-14: 560 mg via INTRAVENOUS
  Filled 2019-09-14: qty 14

## 2019-09-14 MED ORDER — FENTANYL 2.5 MCG/ML W/ROPIVACAINE 0.15% IN NS 100 ML EPIDURAL (ARMC)
EPIDURAL | Status: AC
  Filled 2019-09-14: qty 100

## 2019-09-14 MED ORDER — LORAZEPAM 2 MG/ML IJ SOLN
0.5000 mg | INTRAMUSCULAR | Status: DC | PRN
  Administered 2019-09-14: 0.5 mg via INTRAVENOUS
  Filled 2019-09-14: qty 1

## 2019-09-14 MED ORDER — LACTATED RINGERS IV SOLN: 500.0000 mL | Freq: Once | INTRAVENOUS | Status: DC

## 2019-09-14 MED ORDER — FENTANYL CITRATE (PF) 100 MCG/2ML IJ SOLN
INTRAMUSCULAR | Status: AC
  Filled 2019-09-14: qty 2

## 2019-09-14 MED ORDER — ONDANSETRON HCL 4 MG/2ML IJ SOLN
4.0000 mg | Freq: Three times a day (TID) | INTRAMUSCULAR | Status: DC | PRN
  Administered 2019-09-14: 4 mg via INTRAVENOUS
  Filled 2019-09-14: qty 2

## 2019-09-14 MED ORDER — FENTANYL CITRATE (PF) 100 MCG/2ML IJ SOLN
INTRAMUSCULAR | Status: DC | PRN
  Administered 2019-09-14: 15 ug via INTRATHECAL

## 2019-09-14 MED ORDER — LIDOCAINE HCL (PF) 1 % IJ SOLN
INTRAMUSCULAR | Status: DC | PRN
  Administered 2019-09-14: 1 mL via INTRADERMAL

## 2019-09-14 MED ORDER — MIDAZOLAM HCL 2 MG/2ML IJ SOLN
INTRAMUSCULAR | Status: DC | PRN
  Administered 2019-09-14: 2 mg via INTRAVENOUS

## 2019-09-14 MED ORDER — KETOROLAC TROMETHAMINE 30 MG/ML IJ SOLN: 30.0000 mg | Freq: Four times a day (QID) | INTRAMUSCULAR | Status: AC

## 2019-09-14 MED ORDER — DIPHENHYDRAMINE HCL 25 MG PO CAPS
25.0000 mg | ORAL_CAPSULE | ORAL | Status: DC | PRN
  Filled 2019-09-14: qty 1

## 2019-09-14 MED ORDER — ONDANSETRON HCL 4 MG/2ML IJ SOLN
INTRAMUSCULAR | Status: AC
  Filled 2019-09-14: qty 2

## 2019-09-14 MED ORDER — MORPHINE SULFATE (PF) 0.5 MG/ML IJ SOLN
INTRAMUSCULAR | Status: AC
  Filled 2019-09-14: qty 10

## 2019-09-14 MED ORDER — MIDAZOLAM HCL 2 MG/2ML IJ SOLN
INTRAMUSCULAR | Status: AC
  Filled 2019-09-14: qty 2

## 2019-09-14 MED ORDER — SODIUM CHLORIDE 0.9 % IV SOLN
INTRAVENOUS | Status: DC | PRN
  Administered 2019-09-14: 30 ug/min via INTRAVENOUS

## 2019-09-14 MED ORDER — SUCCINYLCHOLINE CHLORIDE 20 MG/ML IJ SOLN
INTRAMUSCULAR | Status: DC | PRN
Start: 2019-09-14 — End: 2019-09-14
  Administered 2019-09-14: 120 mg via INTRAVENOUS

## 2019-09-14 MED ORDER — SOD CITRATE-CITRIC ACID 500-334 MG/5ML PO SOLN
30.0000 mL | ORAL | Status: AC
  Administered 2019-09-14: 30 mL via ORAL

## 2019-09-14 MED ORDER — NALBUPHINE HCL 10 MG/ML IJ SOLN
5.0000 mg | Freq: Once | INTRAMUSCULAR | Status: DC | PRN
  Filled 2019-09-14: qty 0.5

## 2019-09-14 MED ORDER — LIDOCAINE-EPINEPHRINE (PF) 1.5 %-1:200000 IJ SOLN
INTRAMUSCULAR | Status: DC | PRN
  Administered 2019-09-14: 3 mL via EPIDURAL

## 2019-09-14 MED ORDER — NALOXONE HCL 4 MG/10ML IJ SOLN
1.0000 ug/kg/h | INTRAVENOUS | Status: DC | PRN
  Filled 2019-09-14: qty 5

## 2019-09-14 MED ORDER — SODIUM CHLORIDE 0.9 % IV SOLN
INTRAVENOUS | Status: DC | PRN
  Administered 2019-09-14 (×2): 5 mL via EPIDURAL

## 2019-09-14 MED ORDER — PHENYLEPHRINE 40 MCG/ML (10ML) SYRINGE FOR IV PUSH (FOR BLOOD PRESSURE SUPPORT): 80.0000 ug | PREFILLED_SYRINGE | INTRAVENOUS | Status: DC | PRN

## 2019-09-14 MED ORDER — KETOROLAC TROMETHAMINE 30 MG/ML IJ SOLN
30.0000 mg | Freq: Four times a day (QID) | INTRAMUSCULAR | Status: AC
  Administered 2019-09-14 – 2019-09-15 (×4): 30 mg via INTRAVENOUS
  Filled 2019-09-14 (×4): qty 1

## 2019-09-14 MED ORDER — BUPIVACAINE 0.25 % ON-Q PUMP DUAL CATH 400 ML
400.0000 mL | INJECTION | Status: DC
  Filled 2019-09-14: qty 400

## 2019-09-14 MED ORDER — SODIUM CHLORIDE 0.9% FLUSH: 3.0000 mL | INTRAVENOUS | Status: DC | PRN

## 2019-09-14 MED ORDER — CLINDAMYCIN PHOSPHATE 900 MG/50ML IV SOLN
900.0000 mg | INTRAVENOUS | Status: AC
  Administered 2019-09-14: 900 mg via INTRAVENOUS
  Filled 2019-09-14: qty 50

## 2019-09-14 MED ORDER — NALOXONE HCL 0.4 MG/ML IJ SOLN: 0.4000 mg | INTRAMUSCULAR | Status: DC | PRN

## 2019-09-14 MED ORDER — EPHEDRINE SULFATE 50 MG/ML IJ SOLN
INTRAMUSCULAR | Status: DC | PRN
  Administered 2019-09-14 (×2): 5 mg via INTRAVENOUS

## 2019-09-14 SURGICAL SUPPLY — 23 items
CANISTER SUCT 3000ML PPV (MISCELLANEOUS) ×2 IMPLANT
CATH KIT ON-Q SILVERSOAK 5IN (CATHETERS) IMPLANT
DERMABOND ADVANCED (GAUZE/BANDAGES/DRESSINGS) ×1
DERMABOND ADVANCED .7 DNX12 (GAUZE/BANDAGES/DRESSINGS) ×1 IMPLANT
DRSG OPSITE POSTOP 4X10 (GAUZE/BANDAGES/DRESSINGS) ×2 IMPLANT
ELECT CAUTERY BLADE 6.4 (BLADE) ×2 IMPLANT
ELECT REM PT RETURN 9FT ADLT (ELECTROSURGICAL) ×2
ELECTRODE REM PT RTRN 9FT ADLT (ELECTROSURGICAL) ×1 IMPLANT
GLOVE BIO SURGEON STRL SZ7 (GLOVE) ×2 IMPLANT
GLOVE INDICATOR 7.5 STRL GRN (GLOVE) ×2 IMPLANT
GOWN STRL REUS W/ TWL LRG LVL3 (GOWN DISPOSABLE) ×3 IMPLANT
GOWN STRL REUS W/TWL LRG LVL3 (GOWN DISPOSABLE) ×3
NS IRRIG 1000ML POUR BTL (IV SOLUTION) ×2 IMPLANT
PACK C SECTION (MISCELLANEOUS) ×2 IMPLANT
PAD OB MATERNITY 4.3X12.25 (PERSONAL CARE ITEMS) ×2 IMPLANT
PAD PREP 24X41 OB/GYN DISP (PERSONAL CARE ITEMS) ×2 IMPLANT
PENCIL SMOKE ULTRAEVAC 22 CON (MISCELLANEOUS) ×2 IMPLANT
SUT MNCRL 4-0 (SUTURE) ×1
SUT MNCRL 4-0 27XMFL (SUTURE) ×1
SUT PDS AB 1 TP1 96 (SUTURE) ×2 IMPLANT
SUT VIC AB 0 CTX 36 (SUTURE) ×2
SUT VIC AB 0 CTX36XBRD ANBCTRL (SUTURE) ×2 IMPLANT
SUTURE MNCRL 4-0 27XMF (SUTURE) ×1 IMPLANT

## 2019-09-14 NOTE — Transfer of Care (Signed)
Immediate Anesthesia Transfer of Care Note  Patient: Suzanne Guzman  Procedure(s) Performed: CESAREAN SECTION (N/A Abdomen)  Patient Location: ICU  Anesthesia Type:General and Spinal  Level of Consciousness: awake, alert  and oriented  Airway & Oxygen Therapy: Patient Spontanous Breathing and Patient connected to face mask oxygen  Post-op Assessment: Report given to RN and Post -op Vital signs reviewed and stable  Post vital signs: Reviewed and stable  Last Vitals:  Vitals Value Taken Time  BP 146/114 09/14/19 2045  Temp    Pulse 103 09/14/19 2049  Resp 24 09/14/19 2049  SpO2 100 % 09/14/19 2049  Vitals shown include unvalidated device data.  Last Pain:  Vitals:   09/14/19 1730  TempSrc: Oral  PainSc:          Complications: No complications documented.

## 2019-09-14 NOTE — Anesthesia Procedure Notes (Signed)
Procedure Name: Intubation Date/Time: 09/14/2019 7:04 PM Performed by: Caryl Asp, CRNA Pre-anesthesia Checklist: Patient identified, Patient being monitored, Timeout performed, Emergency Drugs available and Suction available Patient Re-evaluated:Patient Re-evaluated prior to induction Oxygen Delivery Method: Circle system utilized Preoxygenation: Pre-oxygenation with 100% oxygen Induction Type: IV induction and Rapid sequence Laryngoscope Size: Mac and 3 Grade View: Grade I Tube type: Oral Tube size: 6.0 mm Number of attempts: 1 Airway Equipment and Method: Stylet Placement Confirmation: ETT inserted through vocal cords under direct vision,  positive ETCO2 and breath sounds checked- equal and bilateral Secured at: 21 cm Tube secured with: Tape Dental Injury: Teeth and Oropharynx as per pre-operative assessment

## 2019-09-14 NOTE — Progress Notes (Signed)
While in OR patient had epidural bolused. After prepping and draping the patient, a test for pain control was found to be inadequate. Decision made by anesthesia to proceed with spinal.  Spinal placed without difficulty (see anesthesia procedure note). She was placed in the supine position.  She quickly began to state that she couldn't breathe, though her O2 sats were normal. She was asking to sit up. She was reaching for her face.  Attempts were made to calm the patient.  At some point she became apneic and needed intubation.  It was difficult to tell what happened with the patient. However, concern for the fetal status with the mother being apneic with dropping O2 satus arose quickly.  Stat cesarean performed at this point (see surgery note).  Blood noted upon entering the uterus.  Initial concern for seizure and placental abruption.  Patient was quickly intubated and sedated.  Surgery proceeded quickly and without any surgical issues.  She operative report.  She was extubated in the OR and taken to the ICU for further monitoring to assess ongoing stability. Magnesium level was drawn and found to be in the therapeutic range.  It was stopped initially due to what appeared to be some decrease in her mental status from time to time.  Magnesium was restarted at the end of the case.   Prentice Docker, MD, [facility], [facility] 09/14/2019 9:01 PM

## 2019-09-14 NOTE — Anesthesia Preprocedure Evaluation (Addendum)
Anesthesia Evaluation  Patient identified by MRN, date of birth, ID band Patient awake    Reviewed: Allergy & Precautions, H&P , NPO status , Patient's Chart, lab work & pertinent test results  History of Anesthesia Complications Negative for: history of anesthetic complications  Airway Mallampati: III  TM Distance: >3 FB Neck ROM: full    Dental  (+) Chipped   Pulmonary neg pulmonary ROS,    Pulmonary exam normal        Cardiovascular Exercise Tolerance: Good hypertension, Normal cardiovascular exam     Neuro/Psych PSYCHIATRIC DISORDERS    GI/Hepatic negative GI ROS, GERD  ,  Endo/Other    Renal/GU   negative genitourinary   Musculoskeletal   Abdominal   Peds  Hematology negative hematology ROS (+)   Anesthesia Other Findings Pre E  Past Medical History: No date: Anxiety No date: Cholestasis during pregnancy in third trimester No date: Obesity No date: Wolff-Parkinson-White syndrome     Comment:  treated with ablation  Past Surgical History: No date: CARDIAC ELECTROPHYSIOLOGY MAPPING AND ABLATION     Comment:  heart  BMI    Body Mass Index: 43.90 kg/m      Reproductive/Obstetrics (+) Pregnancy                             Anesthesia Physical Anesthesia Plan  ASA: III  Anesthesia Plan: Epidural   Post-op Pain Management:    Induction:   PONV Risk Score and Plan:   Airway Management Planned: Natural Airway  Additional Equipment:   Intra-op Plan:   Post-operative Plan:   Informed Consent: I have reviewed the patients History and Physical, chart, labs and discussed the procedure including the risks, benefits and alternatives for the proposed anesthesia with the patient or authorized representative who has indicated his/her understanding and acceptance.     Dental Advisory Given  Plan Discussed with: Anesthesiologist  Anesthesia Plan Comments: (Patient  reports no bleeding problems and no anticoagulant use.   Patient consented for risks of anesthesia including but not limited to:  - adverse reactions to medications - risk of bleeding, infection and or nerve damage from epidural that could lead to paralysis - risk of headache or failed epidural - nerve damage due to positioning - Damage to heart, brain, lungs, other parts of body or loss of life  Patient voiced understanding.)        Anesthesia Quick Evaluation

## 2019-09-14 NOTE — Consult Note (Addendum)
PULMONARY / CRITICAL CARE MEDICINE  Name: Suzanne Guzman MRN: [id number] DOB: 02/11/1991    LOS: 2  Referring Provider:  Dr. Glennon Mac Reason for Referral: Pre-eclamptic seizures and acute respiratory failure requiring transient mechanical ventilation Brief patient description: 29 y/o AA female admitted for an early delivery due to severe eclampsia, became acutely unresponsive after c/o dyspnea and had with what appeared to be a tonic-clonic seizure. She was emergently intubated and delivered by C-section. She is now extubated and transferred to the ICU for close monitoring and management  HPI: This is a 29 y/o AA female admitted with severe pre-eclampsia.  Her labor was being induced but the baby developed fetal intolerance requiring emergency C-section.  She was initially given an epidural and then a nerve block but then she suddenly became hypoxic, agitated with seizure like activity consistent with tonic-clonic seizures and apneic requiring emergent intubation, and emergent C-section.  She was successfully extubated postprocedure and transferred to the ICU for close monitoring. Upon arrival in the ICU, patient was somnolent but able to follow commands.  No seizure activity observed.  She complained of midsternal pressure that was relieved with low-dose lorazepam, Maalox and IV Protonix.  She does have a history of Wolff-Parkinson-White syndrome that was treated with ablation and severe anxiety.  She denies nausea, vomiting, dizziness, headaches, and numbness.   Significant events June 25th, 2021: Admitted with severe preeclampsia September 14, 2019: Emergent C-section, seizures, acute respiratory insufficiency with hypoxemia requiring emergent intubation and transfer to the ICU postprocedure for close monitoring   Past Medical History:  Diagnosis Date  . Anxiety   . Cholestasis during pregnancy in third trimester   . Obesity   . Wolff-Parkinson-White syndrome    treated with ablation    Past Surgical History:  Procedure Laterality Date  . CARDIAC ELECTROPHYSIOLOGY MAPPING AND ABLATION     heart   No current facility-administered medications on file prior to encounter.   Current Outpatient Medications on File Prior to Encounter  Medication Sig  . cetirizine (ZYRTEC) 10 MG tablet Take 10 mg by mouth daily.  Marland Kitchen omeprazole (PRILOSEC) 20 MG capsule Take 1 capsule (20 mg total) by mouth daily.  . Prenatal Vit-Fe Fumarate-FA (MULTIVITAMIN-PRENATAL) 27-0.8 MG TABS tablet Take 1 tablet by mouth daily at 12 noon.  . ursodiol (ACTIGALL) 500 MG tablet Take 1 tablet (500 mg total) by mouth 2 (two) times daily.    Allergies Allergies  Allergen Reactions  . Nutmeg Oil (Myristica Oil) Anaphylaxis    Allergy to NUTS  . Penicillins Anaphylaxis    Family History Family History  Problem Relation Age of Onset  . Cancer Mother   . Other Father        MDS   Social History  reports that she has never smoked. She has never used smokeless tobacco. She reports previous alcohol use. She reports that she does not use drugs.  Review Of Systems:   Constitutional: Negative for fever and chills.  HENT: Negative for congestion and rhinorrhea.  Eyes: Negative for redness and visual disturbance.  Respiratory: Negative for shortness of breath and wheezing.  Cardiovascular: Negative for chest pain and palpitations but positive for midsternal  pressure without any radiation.  Gastrointestinal: Negative  for nausea , vomiting and abdominal pain and  loose stools Genitourinary: Negative for dysuria and urgency.  Endocrine: Denies polyuria, polyphagia and heat intolerance Musculoskeletal: Negative for myalgias and arthralgias.  Skin: Negative for pallor and wound.  Neurological: Negative for dizziness and headaches   VITAL  SIGNS: BP (!) 141/71   Pulse (!) 115   Temp 98.4 F (36.9 C) (Oral)   Resp 18   Ht 5\' 3"  (1.6 m)   Wt 112.4 kg   LMP 01/14/2019 (Exact Date)   SpO2 98%    Breastfeeding Unknown   BMI 43.90 kg/m   HEMODYNAMICS:    VENTILATOR SETTINGS:    INTAKE / OUTPUT: I/O last 3 completed shifts: In: 7257.3 [P.O.:3033; I.V.:3965; IV Piggyback:259.3] Out: 5350 [Urine:5350]  PHYSICAL EXAMINATION: General: Obese, in no acute distress HEENT: PERRLA, trachea midline, no JVD, no thyromegaly Neuro: Alert and oriented x3, cranial nerves intact, no focal deficits Cardiovascular: RRR, S1-S2, no murmur regurg or gallop, +2 pulses bilaterally, +2 pitting edema bilaterally Lungs: Bilateral breath sounds without any wheezes or rhonchi Abdomen: Boggy, uterus palpable and firm, positive bowel sounds in all 4 quadrants Musculoskeletal: Positive range of motion in upper and lower extremities, no joint deformities Skin: Warm and dry  LABS:  BMET Recent Labs  Lab 09/12/19 1521 09/14/19 1353  NA 137 134*  K 3.7 4.0  CL 108 104  CO2 21* 18*  BUN 5* 10  CREATININE 0.71 0.85  GLUCOSE 103* 109*    Electrolytes Recent Labs  Lab 09/12/19 1521 09/14/19 1353 09/14/19 1924  CALCIUM 9.1 7.6*  --   MG  --   --  5.8*    CBC Recent Labs  Lab 09/12/19 1521 09/13/19 0852 09/14/19 1353  WBC 8.3 11.8* 12.4*  HGB 10.1* 10.0* 9.9*  HCT 29.9* 28.9* 29.1*  PLT 269 263 309    Coag's No results for input(s): APTT, INR in the last 168 hours.  Sepsis Markers No results for input(s): LATICACIDVEN, PROCALCITON, O2SATVEN in the last 168 hours.  ABG No results for input(s): PHART, PCO2ART, PO2ART in the last 168 hours.  Liver Enzymes Recent Labs  Lab 09/12/19 1521 09/14/19 1353  AST 20 28  ALT 14 24  ALKPHOS 147* 140*  BILITOT 0.4 0.5  ALBUMIN 3.2* 3.3*    Cardiac Enzymes No results for input(s): TROPONINI, PROBNP in the last 168 hours.  Glucose Recent Labs  Lab 09/14/19 2034  GLUCAP 108*    Imaging No results found.   STUDIES:  None  CULTURES: OB cultures  ANTIBIOTICS: Vancomycin, gentamicin and clindamycin for prophylaxis and  GBS positive  LINES/TUBES: Peripheral IVs Foley  DISCUSSION: 29 year old female presenting with acute hypoxic respiratory failure due to possible seizure activity from severe preeclampsia; now improved  ASSESSMENT / PLAN:  PULMONARY A: Acute hypoxic respiratory failure requiring emergent intubation P:   Now successfully extubated Continue to wean supplemental oxygen Bronchodilators as needed Incentive spirometry  CARDIOVASCULAR A:  Midsternal chest pain Elevated blood pressure due to severe preeclampsia Lower extremity edema History of Wolf Parkinson White syndrome status post ablation P:  Stat EKG, cardiac enzymes, and routine labs to rule out any electrolyte abnormalities Continue to monitor on telemetry  GASTROINTESTINAL A:   Cholestasis during pregnancy P:   Continue to monitor and consider GI evaluation if symptoms persist or worsen postdelivery  HEMATOLOGIC A:   Acute blood loss anemia P:  Hemoglobin is stable at 9.8 postprocedure Continue to monitor H&H and transfuse per protocol  INFECTIOUS A:   GBS positive P:   Antibiotics per labor and delivery team  ENDOCRINE A:   Obesity-high risk for hyperglycemia P:   Blood glucose monitoring without sliding scale coverage  NEUROLOGIC A:   Severe preeclampsia with preeclamptic seizure-no recurrence since arrival in the ICU Acute pain  due to emergent C-section P:   Seizure protocol As needed lorazepam We will consider neurology evaluation if symptoms recur Neurochecks with motor function every 2 hours Epidural and oral pain management per anesthesia  Psychiatry A:   Anxiety disorder P:   As needed lorazepam Continue to reassure patient nothing prognosis  Best Practice: Code Status: Full code Diet: Clear liquids and advance as tolerated GI prophylaxis: Protonix VTE prophylaxis: SCDs  FAMILY  - Updates: Husband at bedside.  Updated on current treatment plan with patient present.   Plan of  care discussed with Dr. Mortimer Fries.  Agrees with current plan.  We will continue to review and adjust treatment plan based on patient's diagnostics and clinical evolution  Ladan Vanderzanden S. [facility] ANP-BC Pulmonary and Critical Care Medicine [facility] Pager [phone] or [phone]  NB: This document was prepared using Dragon voice recognition software and may include unintentional dictation errors.    09/14/2019, 9:23 PM

## 2019-09-14 NOTE — Progress Notes (Signed)
Labor Check  Subj:  Complaints: none, barely feeling contractions   Obj:  BP (!) 144/87   Pulse (!) 104   Temp 98.4 F (36.9 C) (Oral)   Resp 16   Ht 5\' 3"  (1.6 m)   Wt 112.4 kg   LMP 01/14/2019 (Exact Date)   SpO2 98%   BMI 43.90 kg/m  Dose (milli-units/min) Oxytocin: 8 milli-units/min  Cervix: Dilation: 1 / Effacement (%): 30 / Station: Ballotable  Baseline FHR: 130 beats/min   Variability: moderate   Accelerations: absent   Decelerations: present (occasional runs of late decelerations that do not last long and return to baseline).  Relieved by position changes.  Contractions: present frequency: 2-3 q 10 min Overall assessment: cat 2  A/P: 29 y.o. G1P0000 female at [redacted]w[redacted]d with IOL for preeclampsia with severe features.  1.  Labor: pitocin per protocol (she has completed 1.5 days of cervical ripening with cytotec).  2.  FWB: overall reassuring, but does have periods of minimal variability and late decelerations that respond to intervention, Overall assessment: category 2  3.  GBS positive  4.  Pain: prn 5.  Recheck: prn, difficult to check given patient intolerance of exam.   6. I had a lengthy discussion of the progression of her induction so far.  I discussed my concerns regarding late decelerations with minimal contractions and position changes required to alleviate them.  We will proceed carefully.  However, if the fetus does not tolerate a dose of pitocin strong enough to affect cervical change, we will have to consider c-section for delivery. She received BMTZ dose #2 at 1752 yesterday. So, ideally would get her to this time today, if possible, if c-section becomes indicated.  All patient questions answered. She is very reasonable and would accept a c-section if indicated.   Prentice Docker, MD, [facility], [facility] 09/14/2019 1:23 PM

## 2019-09-14 NOTE — Anesthesia Procedure Notes (Addendum)
Spinal  Patient location during procedure: OR Start time: 09/14/2019 6:57 PM End time: 09/14/2019 6:59 PM Staffing Performed: anesthesiologist  Anesthesiologist: Piscitello, Precious Haws, MD Preanesthetic Checklist Completed: patient identified, IV checked, site marked, risks and benefits discussed, surgical consent, monitors and equipment checked, pre-op evaluation and timeout performed Spinal Block Patient position: sitting Prep: Betadine Patient monitoring: heart rate, continuous pulse ox, blood pressure and cardiac monitor Approach: midline Location: L4-5 Injection technique: single-shot Needle Needle type: Whitacre and Introducer  Needle gauge: 24 G Needle length: 9 cm Assessment Sensory level: T6 Additional Notes Negative paresthesia. Negative blood return. Positive free-flowing CSF. Expiration date of kit checked and confirmed. Patient tolerated procedure well, without complications.

## 2019-09-14 NOTE — Op Note (Signed)
Cesarean Section Operative Note    Patient Name: Suzanne Guzman  MRN: [id number]  Date of Surgery: 09/14/2019   Pre-operative Diagnosis:  1) Fetal intolerance of labor 2) preeclampsia with severe features 3) intrauterine pregnancy at [redacted]w[redacted]d   Post-operative Diagnosis:  1) Fetal intolerance of labor 2) preeclampsia with severe features, concern for eclampsia immediately pre-operatively 3) intrauterine pregnancy at [redacted]w[redacted]d    Procedure: Primary Low Transverse Cesarean Section via Pfannenstiel incision with double layer uterine closure  Surgeon: Surgeon(s) and Role:    Will Bonnet, MD - Primary   Assistants: Dr. Adrian Prows; No other capable assistant available, in surgery requiring high level assistant.  Anesthesia: general   Findings:  1) normal appearing gravid uterus, fallopian tubes, and ovaries 2) viable female infant with APGARs, 1, 4, and 9.  Weight 6 lb 9.5 oz (2,990 grams)   Quantified Blood Loss: 448 mL  Total IV Fluids: 700 ml   Urine Output: 200 mL clear urine at end of case  Specimens: placenta for permanent  Complications: no complications  Disposition: ICU - extubated and stable.   Maternal Condition: stable   Baby condition / location:  NICU  Procedure Details:  The patient was seen in the Holding Room. The risks, benefits, complications, treatment options, and expected outcomes were discussed with the patient. The patient concurred with the proposed plan, giving informed consent. identified as Web designer and the procedure verified as C-Section Delivery. A Time Out was held and the above information confirmed.   After induction of anesthesia, the patient was draped and prepped in the usual sterile manner. She did not have adequate anesthesia.  So, a spinal was placed. She had what appeared to be seizure-like activity and became apneic. She was intubated and an the cesarean section was performed using Pelosi maneuvers.   A Pfannenstiel  incision was made and carried down through the subcutaneous tissue to the fascia. Fascial incision was made and extended transversely. The fascia was separated from the underlying rectus tissue superiorly and inferiorly. The peritoneum was identified and entered. Peritoneal incision was extended longitudinally. The bladder flap was not bluntly or sharply freed from the lower uterine segment. A low transverse uterine incision was made and the hysterotomy was extended with cranial-caudal tension. Blood clots were noted upon entry into the uterus.  Delivered from cephalic presentation was a 2,990 gram Living newborn infant(s) or Female with Apgar scores of 1 at one minute and 4 at five minutes, 9 at 10 minutes. Cord ph was sent the umbilical cord was clamped and cut cord blood was obtained for evaluation. The placenta was removed Intact and appeared normal. The uterine outline, tubes and ovaries appeared normal. The uterine incision was closed with running locked sutures of 0 Vicryl.  A second layer of the same suture was thrown in an imbricating fashion.  A stitch was thrown across the left uterine artery at the level above the bladder to assure hemostasis.  Hemostasis was assured.  The uterus was returned to the abdomen and the paracolic gutters were cleared of all clots and debris.  The rectus muscles were inspected and found to be hemostatic.  The fascia was then reapproximated with running sutures of 1-0 PDS, looped. The subcutaneous tissue was reapproximated using 2-0 plain gut such that no greater than 2cm of dead space remained. The subcuticular closure was performed using 4-0 monocryl. The skin closure was reinforced using surgical skin glue.   The surgical assistant performed tissue retraction, assistance with suturing,  and fundal pressure.  She also performed skin closure.   Instrument, sponge, and needle counts were correct prior the abdominal closure and were correct at the conclusion of the case.  The  patient received Clindamycin and gentamycin IV prior to skin incision (within 30 minutes). For VTE prophylaxis she was wearing SCDs throughout the case.  The assistant surgeon was an MD due to lack of availability of another Counselling psychologist.    Signed: Will Bonnet, MD 09/14/2019 9:01 PM

## 2019-09-14 NOTE — Anesthesia Procedure Notes (Addendum)
Epidural Patient location during procedure: OB Start time: 09/14/2019 3:17 PM End time: 09/14/2019 3:21 PM  Staffing Anesthesiologist: [place] Hudman, Precious Haws, MD Performed: anesthesiologist   Preanesthetic Checklist Completed: patient identified, IV checked, site marked, risks and benefits discussed, surgical consent, monitors and equipment checked, pre-op evaluation and timeout performed  Epidural Patient position: sitting Prep: ChloraPrep Patient monitoring: heart rate, continuous pulse ox and blood pressure Approach: midline Location: L3-L4 Injection technique: LOR saline  Needle:  Needle type: Tuohy  Needle gauge: 17 G Needle length: 9 cm and 9 Needle insertion depth: 7 cm Catheter type: closed end flexible Catheter size: 19 Gauge Catheter at skin depth: 12 cm Test dose: negative and 1.5% lidocaine with Epi 1:200 K  Assessment Sensory level: T10 Events: blood not aspirated, injection not painful, no injection resistance, no paresthesia and negative IV test  Additional Notes 1 attempt Transient left sided hip paresthesia with catheter advancement that resolved with catheter retraction to 12 cm at skin, 5 cm in epidural space Pt. Evaluated and documentation done after procedure finished. Patient identified. Risks/Benefits/Options discussed with patient including but not limited to bleeding, infection, nerve damage, paralysis, failed block, incomplete pain control, headache, blood pressure changes, nausea, vomiting, reactions to medication both or allergic, itching and postpartum back pain. Confirmed with bedside nurse the patient's most recent platelet count. Confirmed with patient that they are not currently taking any anticoagulation, have any bleeding history or any family history of bleeding disorders. Patient expressed understanding and wished to proceed. All questions were answered. Sterile technique was used throughout the entire procedure. Please see nursing notes for  vital signs. Test dose was given through epidural catheter and negative prior to continuing to dose epidural or start infusion. Warning signs of high block given to the patient including shortness of breath, tingling/numbness in hands, complete motor block, or any concerning symptoms with instructions to call for help. Patient was given instructions on fall risk and not to get out of bed. All questions and concerns addressed with instructions to call with any issues or inadequate analgesia.   Patient tolerated the insertion well without immediate complications.Reason for block:procedure for pain

## 2019-09-14 NOTE — Progress Notes (Signed)
Called floor regarding vancomycin. Per MD will start vancomycin once patient in active labor.

## 2019-09-14 NOTE — Progress Notes (Signed)
Labor Check  Subj:  Complaints: comfortable with epidural   Obj:  BP (!) 128/59    Pulse (!) 105    Temp 98.3 F (36.8 C) (Oral)    Resp 18    Ht 5\' 3"  (1.6 m)    Wt 112.4 kg    LMP 01/14/2019 (Exact Date)    SpO2 95%    BMI 43.90 kg/m  Dose (milli-units/min) Oxytocin: 0 milli-units/min  Cervix: Dilation: 2.5 / Effacement (%): 80 / Station: -2  Baseline FHR: 135 beats/min   Variability: moderate   Accelerations: absent   Decelerations: present (recurrent late deceleration, one down to 70s with return to baseline) improved with position changes, IVF bolus, discontinuation of pitocin.   Contractions: present frequency: 1-2 q 10 min Overall assessment: cat 2  Female chaperone present for pelvic exam:   A/P: 29 y.o. G1P0000 female at [redacted]w[redacted]d with preeclampsia with severe preeclampsia.  1.  Labor: discussed fetal intolerance of labor.  Previous discussion regarding possible need for cesarean delivery. Even with intrauterine resuscitation there are late decelerations and I am concerned about the remoteness from delivering vaginally.  We discussed the likely cause of her fetal status.  I recommend proceeding with c-section. She voiced agreement. All questions answered. Patient personally consented by me to proceed.   2.  FWB: guarded, but reassuring at the moment, Overall assessment: category 2  3.  GBS positive - vancomycin, will give gent/clinda in OR for surgical ppx.  4.  Pain: epidural  Prentice Docker, MD, [facility], [facility] 09/14/2019 5:28 PM

## 2019-09-14 NOTE — Progress Notes (Signed)
   09/14/19 1910  Clinical Encounter Type  Visited With Family  Visit Type Initial  Referral From Nurse  Consult/Referral To Chaplain  Spiritual Encounters  Spiritual Needs Prayer;Emotional  Suzanne Guzman responded to code blue. Upon arrival, pt was already in surgery. Pt's husband was in Rm 5 and seemed emotional. CH provided the ministry of presence, built rapport and prayed upon request. Pt's son was in another room receiving medical attention. Husband isn't spiritual but stated that wife is a Advertising account executive. Follow up visits may be welcomed by pt.

## 2019-09-15 ENCOUNTER — Encounter: Payer: Self-pay | Admitting: Obstetrics and Gynecology

## 2019-09-15 DIAGNOSIS — R569 Unspecified convulsions: Secondary | ICD-10-CM

## 2019-09-15 DIAGNOSIS — J9600 Acute respiratory failure, unspecified whether with hypoxia or hypercapnia: Secondary | ICD-10-CM | POA: Diagnosis not present

## 2019-09-15 DIAGNOSIS — J9601 Acute respiratory failure with hypoxia: Secondary | ICD-10-CM

## 2019-09-15 LAB — CBC
HCT: 24.9 % — ABNORMAL LOW (ref 36.0–46.0)
Hemoglobin: 8.5 g/dL — ABNORMAL LOW (ref 12.0–15.0)
MCH: 28.1 pg (ref 26.0–34.0)
MCHC: 34.1 g/dL (ref 30.0–36.0)
MCV: 82.2 fL (ref 80.0–100.0)
Platelets: 261 10*3/uL (ref 150–400)
RBC: 3.03 MIL/uL — ABNORMAL LOW (ref 3.87–5.11)
RDW: 13.2 % (ref 11.5–15.5)
WBC: 14.4 10*3/uL — ABNORMAL HIGH (ref 4.0–10.5)
nRBC: 0 % (ref 0.0–0.2)

## 2019-09-15 LAB — GLUCOSE, CAPILLARY
Glucose-Capillary: 114 mg/dL — ABNORMAL HIGH (ref 70–99)
Glucose-Capillary: 123 mg/dL — ABNORMAL HIGH (ref 70–99)

## 2019-09-15 MED ORDER — LACTATED RINGERS IV SOLN: INTRAVENOUS | Status: DC

## 2019-09-15 MED ORDER — DIPHENHYDRAMINE HCL 25 MG PO CAPS: 25.0000 mg | ORAL_CAPSULE | Freq: Four times a day (QID) | ORAL | Status: DC | PRN

## 2019-09-15 MED ORDER — TETANUS-DIPHTH-ACELL PERTUSSIS 5-2.5-18.5 LF-MCG/0.5 IM SUSP
0.5000 mL | Freq: Once | INTRAMUSCULAR | Status: DC
  Filled 2019-09-15: qty 0.5

## 2019-09-15 MED ORDER — LABETALOL HCL 5 MG/ML IV SOLN: 40.0000 mg | INTRAVENOUS | Status: DC | PRN

## 2019-09-15 MED ORDER — KETOROLAC TROMETHAMINE 30 MG/ML IJ SOLN: 30.0000 mg | Freq: Four times a day (QID) | INTRAMUSCULAR | Status: DC | PRN

## 2019-09-15 MED ORDER — CHLORHEXIDINE GLUCONATE CLOTH 2 % EX PADS: 6.0000 | MEDICATED_PAD | Freq: Every day | CUTANEOUS | Status: DC

## 2019-09-15 MED ORDER — SENNOSIDES-DOCUSATE SODIUM 8.6-50 MG PO TABS
2.0000 | ORAL_TABLET | ORAL | Status: DC
  Administered 2019-09-17 – 2019-09-19 (×3): 2 via ORAL
  Filled 2019-09-15 (×4): qty 2

## 2019-09-15 MED ORDER — DIBUCAINE (PERIANAL) 1 % EX OINT: 1.0000 "application " | TOPICAL_OINTMENT | CUTANEOUS | Status: DC | PRN

## 2019-09-15 MED ORDER — CALCIUM GLUCONATE 10 % IV SOLN
INTRAVENOUS | Status: AC
  Filled 2019-09-15: qty 10

## 2019-09-15 MED ORDER — MENTHOL 3 MG MT LOZG
1.0000 | LOZENGE | OROMUCOSAL | Status: DC | PRN
  Filled 2019-09-15: qty 9

## 2019-09-15 MED ORDER — SIMETHICONE 80 MG PO CHEW: 80.0000 mg | CHEWABLE_TABLET | ORAL | Status: DC | PRN

## 2019-09-15 MED ORDER — PRENATAL MULTIVITAMIN CH
1.0000 | ORAL_TABLET | Freq: Every day | ORAL | Status: DC
  Administered 2019-09-15 – 2019-09-19 (×5): 1 via ORAL
  Filled 2019-09-15 (×5): qty 1

## 2019-09-15 MED ORDER — SIMETHICONE 80 MG PO CHEW
80.0000 mg | CHEWABLE_TABLET | Freq: Three times a day (TID) | ORAL | Status: DC
  Administered 2019-09-15 – 2019-09-19 (×11): 80 mg via ORAL
  Filled 2019-09-15 (×13): qty 1

## 2019-09-15 MED ORDER — LABETALOL HCL 200 MG PO TABS
200.0000 mg | ORAL_TABLET | Freq: Two times a day (BID) | ORAL | Status: DC
  Administered 2019-09-15 – 2019-09-17 (×4): 200 mg via ORAL
  Filled 2019-09-15: qty 2
  Filled 2019-09-15 (×3): qty 1

## 2019-09-15 MED ORDER — OXYCODONE-ACETAMINOPHEN 5-325 MG PO TABS
1.0000 | ORAL_TABLET | ORAL | Status: DC | PRN
  Administered 2019-09-15 – 2019-09-16 (×3): 2 via ORAL
  Administered 2019-09-16: 1 via ORAL
  Administered 2019-09-16 (×2): 2 via ORAL
  Administered 2019-09-17 (×2): 1 via ORAL
  Administered 2019-09-17: 2 via ORAL
  Administered 2019-09-17: 1 via ORAL
  Administered 2019-09-17 – 2019-09-19 (×9): 2 via ORAL
  Filled 2019-09-15: qty 2
  Filled 2019-09-15: qty 1
  Filled 2019-09-15 (×10): qty 2
  Filled 2019-09-15: qty 1
  Filled 2019-09-15 (×2): qty 2
  Filled 2019-09-15: qty 1
  Filled 2019-09-15 (×3): qty 2

## 2019-09-15 MED ORDER — KETOROLAC TROMETHAMINE 30 MG/ML IJ SOLN
30.0000 mg | Freq: Four times a day (QID) | INTRAMUSCULAR | Status: DC | PRN
  Administered 2019-09-15: 30 mg via INTRAVENOUS
  Filled 2019-09-15: qty 1

## 2019-09-15 MED ORDER — COCONUT OIL OIL: 1.0000 "application " | TOPICAL_OIL | Status: DC | PRN

## 2019-09-15 MED ORDER — WITCH HAZEL-GLYCERIN EX PADS: 1.0000 "application " | MEDICATED_PAD | CUTANEOUS | Status: DC | PRN

## 2019-09-15 MED ORDER — MEPERIDINE HCL 25 MG/ML IJ SOLN: 6.2500 mg | INTRAMUSCULAR | Status: DC | PRN

## 2019-09-15 MED ORDER — LABETALOL HCL 5 MG/ML IV SOLN: 80.0000 mg | INTRAVENOUS | Status: DC | PRN

## 2019-09-15 MED ORDER — ZOLPIDEM TARTRATE 5 MG PO TABS: 5.0000 mg | ORAL_TABLET | Freq: Every evening | ORAL | Status: DC | PRN

## 2019-09-15 MED ORDER — SIMETHICONE 80 MG PO CHEW: 80.0000 mg | CHEWABLE_TABLET | ORAL | Status: DC

## 2019-09-15 MED ORDER — OXYTOCIN-SODIUM CHLORIDE 30-0.9 UT/500ML-% IV SOLN: 2.5000 [IU]/h | INTRAVENOUS | Status: DC

## 2019-09-15 MED ORDER — HYDRALAZINE HCL 20 MG/ML IJ SOLN: 10.0000 mg | INTRAMUSCULAR | Status: DC | PRN

## 2019-09-15 MED ORDER — LABETALOL HCL 5 MG/ML IV SOLN: 20.0000 mg | INTRAVENOUS | Status: DC | PRN

## 2019-09-15 NOTE — Progress Notes (Signed)
Subjective: Postpartum Day1: Cesarean Delivery Patient reports mild  incisional pain. She has just returned form the ICU where she was treated immediately postoperatively.she will now continue on magnesium sulphate on the L and  unit until 1900. She is expressing fatigue; that she has not gotten much sleep. She denies headache, chest pain.  Objective: Vital signs in last 24 hours: Temp:  [98.4 F (36.9 C)-98.9 F (37.2 C)] 98.6 F (37 C) (06/28 1330) Pulse Rate:  [89-120] 99 (06/28 1431) Resp:  [15-34] 15 (06/28 1431) BP: (117-161)/(59-114) 159/98 (06/28 1431) SpO2:  [91 %-100 %] 93 % (06/28 1519)  Physical Exam:  General: cooperative and fatigued Lochia: appropriate Uterine Fundus: firm Incision: no significant drainage DVT Evaluation: No evidence of DVT seen on physical exam. Negative Homan's sign. No cords or calf tenderness.  Recent Labs    09/14/19 2149 09/15/19 0810  HGB 9.8* 8.5*  HCT 29.1* 24.9*    Assessment/Plan: Status post Cesarean section. Postoperative course complicated by hypertension. eclamptic seizure just prior to CS with subsequent intubation, CS delivery under general and transfer to ICU  Postoperative orders placed, including medication protocol for extreme Bps. Will continue on magnesium sulphate until 1900 this evening. Monitor Bps carefully. Anticipate move to  Postpartum this evening.Imagene Riches 09/15/2019, 3:41 PM

## 2019-09-15 NOTE — Anesthesia Postprocedure Evaluation (Signed)
Anesthesia Post Note  Patient: Ezzie Deyoung  Procedure(s) Performed: CESAREAN SECTION (N/A Abdomen)  Patient location during evaluation: SICU Anesthesia Type: General Level of consciousness: awake and alert Pain management: pain level controlled Vital Signs Assessment: post-procedure vital signs reviewed and stable Respiratory status: spontaneous breathing, nonlabored ventilation and respiratory function stable Cardiovascular status: blood pressure returned to baseline and stable Postop Assessment: no apparent nausea or vomiting Anesthetic complications: no   No complications documented.   Last Vitals:  Vitals:   09/15/19 0515 09/15/19 0545  BP:    Pulse: 91 89  Resp: 19 18  Temp:    SpO2: 99% 94%    Last Pain:  Vitals:   09/15/19 0400  TempSrc: Oral  PainSc: 3                  Precious Haws Corleen Otwell

## 2019-09-15 NOTE — Progress Notes (Signed)
1230 Report called to [place] on Labor and Delivery 3rd floor.

## 2019-09-15 NOTE — Lactation Note (Signed)
This note was copied from a baby's chart. Lactation Consultation Note  Patient Name: Suzanne Guzman KPTWS'F Date: 09/15/2019 Reason for consult: Follow-up assessment;Mother's request;Primapara;NICU baby;Late-preterm 34-36.6wks;Other (Comment) (Assisted mom in [place] with pumping)  Mom seized in OR and abrupted. Transferred to ICU overnight.  Now in [facility] on Mag.  [name] is 34.5 weeker who was resuscitated in [place] and has been extubated now in SCN.  Symphony DEBP set up in ICU with instructions to parents on warmth, breast massage, hand expression, pumping, collection, storage, cleaning, labeling and handling of expressed milk.  Encouraged mom to try and pump every 2 to 4 hours or around 8 to 12 times in 24 hours.  Mom has been very drowzy on Mag and needed much assistance.  Mom is allergic to coconut oil.  Tender care lanolin given to rub around flange of pump to get better seal and for comfort.  MOm seems more comfortable with #24 flanges at present.  Mom denies pain with pumping. Mom has [employer], but has not made arrangements to get pump through insurance.  Process of how to go through [employer] to get DEBP discussed and mom was going to try to call as soon as she could.  If mom goes home without baby and has not received pump through insurance, she may need to rent or be loaned a pump through [employer]. Encouraged mom to call with any questions, concerns or when needed assistance pumping.  Maternal Data Formula Feeding for Exclusion: No Has patient been taught Hand Expression?: Yes Does the patient have breastfeeding experience prior to this delivery?: No (Gr1)  Feeding    LATCH Score                   Interventions Interventions: Breast feeding basics reviewed;Breast massage;Hand express;Pre-pump if needed;Expressed milk;DEBP  Lactation Tools Discussed/Used Tools: Pump (Allergic to coconut oil-Tender care lanolin given to use) Breast pump type: Double-Electric  Breast Pump (Symphony set up in room) [place] [employer]: No [employer]) Pump Review: Setup, frequency, and cleaning;Milk Storage;Other (comment) Initiated by:: S.Baley Lorimer,RN,BSN,IBCLC Date initiated:: 09/15/19   Consult Status Consult Status: PRN Follow-up type: Call as needed    Jarold Motto 09/15/2019, 8:49 PM

## 2019-09-15 NOTE — Progress Notes (Signed)
Transferred to L&D via bed with techs. No acute distress, Vital signs stable. Mag drip and all personal belongings went with patient.

## 2019-09-15 NOTE — Progress Notes (Signed)
Ch visited with Pt and family as per recommendation by on-call chaplain previous night. Ch introduced self and let them know that she was there to offer support. Pt said that she was okay.

## 2019-09-16 ENCOUNTER — Encounter: Payer: Self-pay | Admitting: Obstetrics and Gynecology

## 2019-09-16 LAB — CBC
HCT: 24.3 % — ABNORMAL LOW (ref 36.0–46.0)
Hemoglobin: 8.2 g/dL — ABNORMAL LOW (ref 12.0–15.0)
MCH: 27.7 pg (ref 26.0–34.0)
MCHC: 33.7 g/dL (ref 30.0–36.0)
MCV: 82.1 fL (ref 80.0–100.0)
Platelets: 229 10*3/uL (ref 150–400)
RBC: 2.96 MIL/uL — ABNORMAL LOW (ref 3.87–5.11)
RDW: 13.3 % (ref 11.5–15.5)
WBC: 10.9 10*3/uL — ABNORMAL HIGH (ref 4.0–10.5)
nRBC: 0 % (ref 0.0–0.2)

## 2019-09-16 MED ORDER — IBUPROFEN 600 MG PO TABS
600.0000 mg | ORAL_TABLET | Freq: Four times a day (QID) | ORAL | Status: DC | PRN
  Administered 2019-09-16 – 2019-09-19 (×8): 600 mg via ORAL
  Filled 2019-09-16 (×8): qty 1

## 2019-09-16 MED ORDER — WITCH HAZEL-GLYCERIN EX PADS: 1.0000 "application " | MEDICATED_PAD | CUTANEOUS | Status: DC | PRN

## 2019-09-16 MED ORDER — SIMETHICONE 80 MG PO CHEW: 80.0000 mg | CHEWABLE_TABLET | Freq: Three times a day (TID) | ORAL | Status: DC

## 2019-09-16 MED ORDER — SENNOSIDES-DOCUSATE SODIUM 8.6-50 MG PO TABS
2.0000 | ORAL_TABLET | ORAL | Status: DC
Start: 2019-09-16 — End: 2019-09-16

## 2019-09-16 MED ORDER — SIMETHICONE 80 MG PO CHEW: 80.0000 mg | CHEWABLE_TABLET | ORAL | Status: DC | PRN

## 2019-09-16 MED ORDER — PRENATAL MULTIVITAMIN CH
1.0000 | ORAL_TABLET | Freq: Every day | ORAL | Status: DC
Start: 2019-09-16 — End: 2019-09-16

## 2019-09-16 MED ORDER — DIBUCAINE (PERIANAL) 1 % EX OINT: 1.0000 "application " | TOPICAL_OINTMENT | CUTANEOUS | Status: DC | PRN

## 2019-09-16 MED ORDER — COCONUT OIL OIL: 1.0000 "application " | TOPICAL_OIL | Status: DC | PRN

## 2019-09-16 MED ORDER — PANTOPRAZOLE SODIUM 40 MG PO TBEC
40.0000 mg | DELAYED_RELEASE_TABLET | Freq: Every day | ORAL | Status: DC
  Administered 2019-09-16 – 2019-09-18 (×3): 40 mg via ORAL
  Filled 2019-09-16 (×4): qty 1

## 2019-09-16 MED ORDER — ACETAMINOPHEN 500 MG PO TABS
1000.0000 mg | ORAL_TABLET | Freq: Four times a day (QID) | ORAL | Status: DC
  Filled 2019-09-16: qty 2

## 2019-09-16 MED ORDER — LACTATED RINGERS IV SOLN
INTRAVENOUS | Status: DC
Start: 2019-09-16 — End: 2019-09-16

## 2019-09-16 MED ORDER — DIPHENHYDRAMINE HCL 25 MG PO CAPS: 25.0000 mg | ORAL_CAPSULE | Freq: Four times a day (QID) | ORAL | Status: DC | PRN

## 2019-09-16 MED ORDER — MENTHOL 3 MG MT LOZG: 1.0000 | LOZENGE | OROMUCOSAL | Status: DC | PRN

## 2019-09-16 MED ORDER — SIMETHICONE 80 MG PO CHEW: 80.0000 mg | CHEWABLE_TABLET | ORAL | Status: DC

## 2019-09-16 MED ORDER — TETANUS-DIPHTH-ACELL PERTUSSIS 5-2.5-18.5 LF-MCG/0.5 IM SUSP: 0.5000 mL | Freq: Once | INTRAMUSCULAR | Status: DC

## 2019-09-16 MED ORDER — ZOLPIDEM TARTRATE 5 MG PO TABS: 5.0000 mg | ORAL_TABLET | Freq: Every evening | ORAL | Status: DC | PRN

## 2019-09-16 MED ORDER — OXYTOCIN-SODIUM CHLORIDE 30-0.9 UT/500ML-% IV SOLN: 2.5000 [IU]/h | INTRAVENOUS | Status: DC

## 2019-09-16 NOTE — Progress Notes (Signed)
Pt called out around 1515 and stated she felt light-headed and dizzy.  Nurse tech and nurse went in to assess pt. Pt was very anxious.  BP at 1515: 166/103 Rechecked at 1537: 169/112 20mg  of labetalol given at 1548 BP at 1600: 170/105 40mg  of labetalol given at 1610 BP at 1621: 152/100 At this time, next labetalol dose not needed per parameters.  Dr. Kenton Kingfisher notified at 1540.   Rechecked BP after this episode at 1745 and it was 146/88. Pt stated she was feeling much better and wanted to try to continue to nap at this time.

## 2019-09-16 NOTE — Progress Notes (Signed)
Obstetric Postpartum/PostOperative Daily Progress Note Subjective:  29 y.o. G1P0101 post-operative day # 2 status post primary cesarean delivery under general anesthesia.  She is ambulating, is tolerating po, is voiding spontaneously.  Her pain is well controlled on PO pain medications. Her lochia is less than menses. She denies HA, visual changes, and RUQ pain. She notes feeling a little lightheaded at times.     Medications SCHEDULED MEDICATIONS  . acetaminophen  1,000 mg Oral Q6H  . amLODipine  10 mg Oral Daily  . Chlorhexidine Gluconate Cloth  6 each Topical Daily  . labetalol  200 mg Oral BID  . pantoprazole (PROTONIX) IV  40 mg Intravenous Q24H  . prenatal multivitamin  1 tablet Oral Q1200  . senna-docusate  2 tablet Oral Q24H  . simethicone  80 mg Oral TID PC  . simethicone  80 mg Oral Q24H  . Tdap  0.5 mL Intramuscular Once  . ursodiol  300 mg Oral TID    MEDICATION INFUSIONS  . lactated ringers Stopped (09/15/19 2000)  . lactated ringers    . magnesium sulfate Stopped (09/15/19 1905)  . naLOXone (NARCAN) adult infusion for PRURITIS      PRN MEDICATIONS  alum & mag hydroxide-simeth, coconut oil, witch hazel-glycerin **AND** dibucaine, diphenhydrAMINE **OR** diphenhydrAMINE, diphenhydrAMINE, fentaNYL (SUBLIMAZE) injection, labetalol **AND** labetalol **AND** labetalol **AND** hydrALAZINE **AND** Measure blood pressure, hydrOXYzine, ibuprofen, ketorolac, LORazepam, menthol-cetylpyridinium, nalbuphine **OR** nalbuphine, nalbuphine **OR** nalbuphine, naloxone **AND** sodium chloride flush, naLOXone (NARCAN) adult infusion for PRURITIS, nitroGLYCERIN, ondansetron (ZOFRAN) IV, oxyCODONE-acetaminophen, simethicone, zolpidem    Objective:   Vitals:   09/16/19 0513 09/16/19 0543 09/16/19 0601 09/16/19 0855  BP: (!) 150/78 (!) 152/94 (!) 144/85 (!) 157/97  Pulse: 86 95 94 89  Resp:    18  Temp:    98.5 F (36.9 C)  TempSrc:    Oral  SpO2:    100%  Weight:      Height:         Current Vital Signs 24h Vital Sign Ranges  T 98.5 F (36.9 C) Temp  Avg: 98.4 F (36.9 C)  Min: 98.1 F (36.7 C)  Max: 98.6 F (37 C)  BP (!) 157/97 (nurse [place] notified) BP  Min: 122/82  Max: 171/100  HR 89 Pulse  Avg: 92.8  Min: 79  Max: 103  RR 18 Resp  Avg: 18.8  Min: 12  Max: 27  SaO2 100 % (Room Air) Room Air SpO2  Avg: 94.9 %  Min: 91 %  Max: 100 %       24 Hour I/O Current Shift I/O  Time Ins Outs 06/28 0701 - 06/29 0700 In: 4354.8 [P.O.:1340; I.V.:2514.8] Out: 2825 [Urine:2825] No intake/output data recorded.  General: NAD Pulmonary: no increased work of breathing Abdomen: non-distended, non-tender, fundus firm at level of umbilicus Inc: Clean/dry/intact Extremities: no edema, no erythema, no tenderness  Labs:  Recent Labs  Lab 09/14/19 2149 09/15/19 0810 09/16/19 0556  WBC 12.4* 14.4* 10.9*  HGB 9.8* 8.5* 8.2*  HCT 29.1* 24.9* 24.3*  PLT 279 261 229     Assessment:   29 y.o. G1P0101 postoperative day # 2 status post primary cesarean section, preeclampsia with severe features, s/p possible eclamptic seizure.  Plan:  1) Acute blood loss anemia - hemodynamically stable and asymptomatic - po ferrous sulfate - Recheck CBC in AM or prn symptoms  2) A POS Performed at [facility], [address] [place], [place] [address]  / Charlynn Grimes 1.79 (01/19 1046)/ Varicella Immune  3) TDAP status 09/05/19  4) breast feeding /Contraception = unsure  5) Preeclampsia with severe features (possible eclamptic seizure): add labetalol to further lower blood pressures.  S/p 24 hours postpartum magnesium sulfate  6) Disposition  Will Bonnet, MD, [place] 09/16/2019 9:59 AM

## 2019-09-16 NOTE — Lactation Note (Signed)
This note was copied from a baby's chart. Lactation Consultation Note  Patient Name: Suzanne Guzman Date: 09/16/2019    [facility] briefly checked in with mom, baby remaining in SCN. Mom is being treated for high blood pressure, and fears that pumping may interfere; RN present in the room, and will continue to monitor.    Suzanne Guzman 09/16/2019, 4:14 PM

## 2019-09-17 LAB — CBC
HCT: 26.1 % — ABNORMAL LOW (ref 36.0–46.0)
Hemoglobin: 8.6 g/dL — ABNORMAL LOW (ref 12.0–15.0)
MCH: 27.2 pg (ref 26.0–34.0)
MCHC: 33 g/dL (ref 30.0–36.0)
MCV: 82.6 fL (ref 80.0–100.0)
Platelets: 260 10*3/uL (ref 150–400)
RBC: 3.16 MIL/uL — ABNORMAL LOW (ref 3.87–5.11)
RDW: 13 % (ref 11.5–15.5)
WBC: 12 10*3/uL — ABNORMAL HIGH (ref 4.0–10.5)
nRBC: 0 % (ref 0.0–0.2)

## 2019-09-17 LAB — SURGICAL PATHOLOGY

## 2019-09-17 MED ORDER — NIFEDIPINE ER OSMOTIC RELEASE 30 MG PO TB24
30.0000 mg | ORAL_TABLET | Freq: Every day | ORAL | Status: DC
  Administered 2019-09-17: 30 mg via ORAL
  Filled 2019-09-17 (×2): qty 1

## 2019-09-17 MED ORDER — LABETALOL HCL 200 MG PO TABS
300.0000 mg | ORAL_TABLET | Freq: Two times a day (BID) | ORAL | Status: DC
  Administered 2019-09-17 – 2019-09-19 (×4): 300 mg via ORAL
  Filled 2019-09-17 (×5): qty 1

## 2019-09-17 NOTE — Progress Notes (Signed)
Obstetric Postpartum/PostOperative Daily Progress Note Subjective:  29 y.o. G1P0101 post-operative day # 3 status post primary cesarean delivery.  She is ambulating, is tolerating po, is voiding spontaneously.  Her pain is well controlled on PO pain medications. Her lochia is less than menses. She is working with lactation to establish milk supply. We discussed continuing in-hospital care for now as her blood pressures are still elevated primarily in the mild range. She was treated once this morning for severe range.    Medications SCHEDULED MEDICATIONS  . acetaminophen  1,000 mg Oral Q6H  . labetalol  300 mg Oral BID  . NIFEdipine  30 mg Oral Daily  . pantoprazole  40 mg Oral Daily  . prenatal multivitamin  1 tablet Oral Q1200  . senna-docusate  2 tablet Oral Q24H  . simethicone  80 mg Oral TID PC  . Tdap  0.5 mL Intramuscular Once    MEDICATION INFUSIONS    PRN MEDICATIONS  alum & mag hydroxide-simeth, coconut oil, witch hazel-glycerin **AND** dibucaine, diphenhydrAMINE, labetalol **AND** labetalol **AND** labetalol **AND** hydrALAZINE **AND** Measure blood pressure, hydrOXYzine, ibuprofen, LORazepam, menthol-cetylpyridinium, nitroGLYCERIN, ondansetron (ZOFRAN) IV, oxyCODONE-acetaminophen, [DISCONTINUED] naloxone **AND** sodium chloride flush, zolpidem    Objective:   Vitals:   09/17/19 0758 09/17/19 0818 09/17/19 0922 09/17/19 1158  BP: (!) 171/107 (!) 156/106 (!) 155/102 (!) 149/105  Pulse:  89 95 94  Resp:   18 18  Temp:   98.9 F (37.2 C) 99.4 F (37.4 C)  TempSrc:   Oral Oral  SpO2:   99% 100%  Weight:      Height:       Patient Vitals for the past 24 hrs:  BP Temp Temp src Pulse Resp SpO2  09/17/19 1158 (!) 149/105 99.4 F (37.4 C) Oral 94 18 100 %  09/17/19 0922 (!) 155/102 98.9 F (37.2 C) Oral 95 18 99 %  09/17/19 0818 (!) 156/106 -- -- 89 -- --  09/17/19 0758 (!) 171/107 -- -- -- -- --  09/17/19 0738 (!) 175/115 99.8 F (37.7 C) Oral 96 20 98 %  09/17/19 0400  (!) 134/92 -- -- 86 -- --  09/17/19 0310 (!) 152/102 -- -- 89 -- --  09/17/19 0245 (!) 158/108 99.2 F (37.3 C) Oral 91 18 99 %  09/16/19 2319 133/84 98.7 F (37.1 C) Oral 90 18 97 %  09/16/19 2040 (!) 149/99 -- -- -- -- --  09/16/19 2025 (!) 169/109 -- -- 97 18 99 %  09/16/19 1927 (!) 152/95 98.8 F (37.1 C) Oral 98 18 99 %  09/16/19 1745 (!) 146/88 -- -- 89 -- --  09/16/19 1621 (!) 152/100 -- -- 84 20 100 %  09/16/19 1600 (!) 170/105 -- -- 88 20 99 %  09/16/19 1538 (!) 169/112 99.1 F (37.3 C) Oral 92 20 100 %  09/16/19 1537 (!) 169/112 -- -- 92 -- --  09/16/19 1515 (!) 166/103 99.1 F (37.3 C) Oral -- 18 100 %     Current Vital Signs 24h Vital Sign Ranges  T 99.4 F (37.4 C) Temp  Avg: 99.1 F (37.3 C)  Min: 98.7 F (37.1 C)  Max: 99.8 F (37.7 C)  BP (!) 149/105 BP  Min: 133/84  Max: 175/115  HR 94 Pulse  Avg: 91.3  Min: 84  Max: 98  RR 18 Resp  Avg: 18.7  Min: 18  Max: 20  SaO2 100 % Room Air SpO2  Avg: 99.1 %  Min: 97 %  Max:  100 %       24 Hour I/O Current Shift I/O  Time Ins Outs 06/29 0701 - 06/30 0700 In: 240 [P.O.:240] Out: -  No intake/output data recorded.  General: NAD Pulmonary: no increased work of breathing Abdomen: non-distended, non-tender, fundus firm at level of umbilicus Inc: Clean/dry/intact Extremities: no edema, no erythema, no tenderness  Labs:  Recent Labs  Lab 09/15/19 0810 09/16/19 0556 09/17/19 0521  WBC 14.4* 10.9* 12.0*  HGB 8.5* 8.2* 8.6*  HCT 24.9* 24.3* 26.1*  PLT 261 229 260     Assessment:   29 y.o. G1P0101 postoperative day # 3 status post primary cesarean section, lactating  Plan:  1) Acute blood loss anemia - hemodynamically stable and asymptomatic - po ferrous sulfate  2) Preeclampsia: discontinue amlodipine, start procardia 30 xl, increase labetalol from 200 to 300 mg BID  3) A POS Performed at [facility], [address] [place], [place] [address]  / Charlynn Grimes 1.79 (01/19 1046)/ Varicella  Immune  4) TDAP status given antepartum  5) breast/pumping/Contraception = not discussed at this time  6) Disposition: continue current care   Rod Can, CNM 09/17/2019 12:20 PM

## 2019-09-17 NOTE — Lactation Note (Signed)
This note was copied from a baby's chart. Lactation Consultation Note  Patient Name: Suzanne Guzman HUTML'Y Date: 09/17/2019 Reason for consult: Follow-up assessment;NICU baby;Late-preterm 34-36.6wks;Infant < 6lbs  Lactation follow-up. Mom is feeling better, continues to have BP monitored, but up to pumping and beginning routine of every 2-3 hours. Mom reports putting together parts for the pump independently and verbalizes how to turn/operate pump without any concerns. Praised mom for her continued dedication to provide baby with her own breast milk. Reviewed hands on pumping, breast massage, and warmth while pumping, and adjustment of suction level for toleration. No questions/concerns voiced at this time. LC to follow-up as needed.  Maternal Data Formula Feeding for Exclusion: No Has patient been taught Hand Expression?: Yes (previously; mom remembers how) Does the patient have breastfeeding experience prior to this delivery?: No  Feeding Feeding Type: Donor Breast Milk  LATCH Score                   Interventions Interventions: Breast feeding basics reviewed;DEBP  Lactation Tools Discussed/Used Tools: Pump Breast pump type: Double-Electric Breast Pump   Consult Status Consult Status: PRN Date: 09/17/19 Follow-up type: Call as needed    Suzanne Guzman 09/17/2019, 12:12 PM

## 2019-09-18 MED ORDER — NIFEDIPINE ER OSMOTIC RELEASE 30 MG PO TB24
60.0000 mg | ORAL_TABLET | Freq: Every day | ORAL | Status: DC
  Administered 2019-09-18 – 2019-09-19 (×2): 60 mg via ORAL
  Filled 2019-09-18 (×2): qty 2

## 2019-09-18 NOTE — Lactation Note (Signed)
This note was copied from a baby's chart. Lactation Consultation Note  Patient Name: Suzanne Guzman BVAPO'L Date: 09/18/2019 Reason for consult: Follow-up assessment;1st time breastfeeding;Late-preterm 34-36.6wks;NICU baby  LC present for attempted BF/lick and learn with [name]- SCN10. Mom present and has goals of fully BF. [name] was awake and alert in bassinet maintaining suction on green pacifier. [place] assisted mom with getting comfortable and placing pillows for football hold. LC un-swaddled baby and brought to breast for football hold on the right side. Baby immediately became agitated and angry, pushing away, after several minutes baby was moved to mom's chest for comfort, continuing to cry- maintaining all stats.  After beginning tube feed, RN assisted with swaddling the baby, and continued comfort measures on mom's chest and baby calmed down.  LC provided reassurance to mom that everything was ok, breastfeeding was a process, and we are optimistic of baby's ability to breastfeed. LC encouraged mom to pump once she returns to her room and maintain pumping schedule.  LC will continue to follow-up as needed/called to [facility].   Maternal Data Formula Feeding for Exclusion: No Has patient been taught Hand Expression?: Yes Does the patient have breastfeeding experience prior to this delivery?: No  Feeding Feeding Type: Donor Breast Milk  LATCH Score Latch: Too sleepy or reluctant, no latch achieved, no sucking elicited. (baby irritable/angry)  Audible Swallowing: None  Type of Nipple: Everted at rest and after stimulation  Comfort (Breast/Nipple): Soft / non-tender  Hold (Positioning): Assistance needed to correctly position infant at breast and maintain latch.  LATCH Score: 5  Interventions Interventions: Breast feeding basics reviewed;Position options;Support pillows;Adjust position;Hand express;Assisted with latch  Lactation Tools Discussed/Used Tools: 50F feeding tube /  Syringe   Consult Status Consult Status: PRN Date: 09/18/19 Follow-up type: Call as needed    Lavonia Drafts 09/18/2019, 12:09 PM

## 2019-09-18 NOTE — Progress Notes (Signed)
Subjective:  Tolerating po, no N/V.  Pain well controlled on current po regimen.  No headaches or vision changes.  Minimal lochia.   Objective:  Vital signs in last 24 hours: Temp:  [98.5 F (36.9 C)-99.4 F (37.4 C)] 98.8 F (37.1 C) (07/01 0821) Pulse Rate:  [92-109] 106 (07/01 0821) Resp:  [16-20] 18 (07/01 0821) BP: (140-157)/(87-105) 140/87 (07/01 0821) SpO2:  [97 %-100 %] 99 % (07/01 0821) Last BM Date: 09/13/19  Intake/Output      06/30 0701 - 07/01 0700 07/01 0701 - 07/02 0700   P.O.     Total Intake(mL/kg)     Net            General: NAD, well nourished, appears stated age Pulmonary: no increased work of breathing Abdomen: non-distended, non-tender, fundus firm at level of umbilicus Incision: deferred as patient in NICU Extremities: trace BLE pretibial edema, no erythema, no tenderness  Results for orders placed or performed during the hospital encounter of 09/12/19 (from the past 72 hour(s))  Glucose, capillary     Status: Abnormal   Collection Time: 09/15/19 11:44 AM  Result Value Ref Range   Glucose-Capillary 123 (H) 70 - 99 mg/dL    Comment: Glucose reference range applies only to samples taken after fasting for at least 8 hours.  CBC     Status: Abnormal   Collection Time: 09/16/19  5:56 AM  Result Value Ref Range   WBC 10.9 (H) 4.0 - 10.5 K/uL   RBC 2.96 (L) 3.87 - 5.11 MIL/uL   Hemoglobin 8.2 (L) 12.0 - 15.0 g/dL   HCT 24.3 (L) 36 - 46 %   MCV 82.1 80.0 - 100.0 fL   MCH 27.7 26.0 - 34.0 pg   MCHC 33.7 30.0 - 36.0 g/dL   RDW 13.3 11.5 - 15.5 %   Platelets 229 150 - 400 K/uL   nRBC 0.0 0.0 - 0.2 %    Comment: Performed at [facility], [address] [place], [place] [address]  CBC     Status: Abnormal   Collection Time: 09/17/19  5:21 AM  Result Value Ref Range   WBC 12.0 (H) 4.0 - 10.5 K/uL   RBC 3.16 (L) 3.87 - 5.11 MIL/uL   Hemoglobin 8.6 (L) 12.0 - 15.0 g/dL   HCT 26.1 (L) 36 - 46 %   MCV 82.6 80.0 - 100.0 fL   MCH 27.2 26.0 -  34.0 pg   MCHC 33.0 30.0 - 36.0 g/dL   RDW 13.0 11.5 - 15.5 %   Platelets 260 150 - 400 K/uL   nRBC 0.0 0.0 - 0.2 %    Comment: Performed at [facility], [address], [place], [place] [address]    Immunization History  Administered Date(s) Administered  . Tdap 09/05/2019    Assessment:   29 y.o. G1P0101 postoperativeday #4 1LTCS eclampsia   Plan:  1) Acute blood loss anemia - hemodynamically stable and asymptomatic - po ferrous sulfate  2) Blood Type --/--/A POS Performed at [facility], [address] [place], [place] [address]  [phone]/25 1752) / Rubella 1.79 (01/19 1046) / Varicella Immune  3) TDAP status up to date  4) Feeding plan breast, undecided on contraception  5) Eclampsia - continue labetalol 300mg  po bid, procardia increased to 60mg  daily.   - s/p 24-hr magnesium sulfate - continue inpatient observation given further escelation in antihypertensive treatment.   6) Disposition - anticipate discharge tomorrow if adequate BP control  Suzanne Mood, MD, [facility], [facility] 09/18/2019, 10:50 AM

## 2019-09-19 ENCOUNTER — Encounter: Payer: BC Managed Care – PPO | Admitting: Obstetrics

## 2019-09-19 DIAGNOSIS — F419 Anxiety disorder, unspecified: Secondary | ICD-10-CM

## 2019-09-19 MED ORDER — NIFEDIPINE ER OSMOTIC RELEASE 30 MG PO TB24: 90.0000 mg | ORAL_TABLET | Freq: Every day | ORAL | Status: DC

## 2019-09-19 MED ORDER — LABETALOL HCL 300 MG PO TABS: 300.0000 mg | ORAL_TABLET | Freq: Two times a day (BID) | ORAL | 2 refills | Status: DC

## 2019-09-19 MED ORDER — ESCITALOPRAM OXALATE 10 MG PO TABS
5.0000 mg | ORAL_TABLET | Freq: Every day | ORAL | Status: DC
  Administered 2019-09-19: 5 mg via ORAL
  Filled 2019-09-19: qty 1

## 2019-09-19 MED ORDER — ESCITALOPRAM OXALATE 5 MG PO TABS: ORAL_TABLET | ORAL | 1 refills | Status: DC

## 2019-09-19 MED ORDER — IBUPROFEN 600 MG PO TABS: 600.0000 mg | ORAL_TABLET | Freq: Four times a day (QID) | ORAL | 1 refills | Status: AC | PRN

## 2019-09-19 MED ORDER — NORETHINDRONE 0.35 MG PO TABS: 1.0000 | ORAL_TABLET | Freq: Every day | ORAL | 11 refills | Status: AC

## 2019-09-19 MED ORDER — ENOXAPARIN SODIUM 40 MG/0.4ML ~~LOC~~ SOLN
40.0000 mg | SUBCUTANEOUS | Status: DC
  Administered 2019-09-19: 40 mg via SUBCUTANEOUS
  Filled 2019-09-19: qty 0.4

## 2019-09-19 MED ORDER — HYDROXYZINE HCL 25 MG PO TABS: 25.0000 mg | ORAL_TABLET | Freq: Four times a day (QID) | ORAL | 1 refills | Status: AC | PRN

## 2019-09-19 MED ORDER — OMEPRAZOLE 20 MG PO CPDR: 20.0000 mg | DELAYED_RELEASE_CAPSULE | Freq: Every day | ORAL | 2 refills | Status: AC

## 2019-09-19 MED ORDER — NIFEDIPINE ER OSMOTIC RELEASE 30 MG PO TB24
30.0000 mg | ORAL_TABLET | ORAL | Status: AC
  Administered 2019-09-19: 30 mg via ORAL
  Filled 2019-09-19: qty 1

## 2019-09-19 MED ORDER — ENOXAPARIN SODIUM 40 MG/0.4ML ~~LOC~~ SOLN: 40.0000 mg | SUBCUTANEOUS | 0 refills | Status: DC

## 2019-09-19 MED ORDER — NIFEDIPINE ER OSMOTIC RELEASE 90 MG PO TB24: 90.0000 mg | ORAL_TABLET | Freq: Every day | ORAL | 0 refills | Status: DC

## 2019-09-19 MED ORDER — POLYSACCHARIDE IRON COMPLEX 150 MG PO CAPS: 150.0000 mg | ORAL_CAPSULE | Freq: Every day | ORAL | 1 refills | Status: AC

## 2019-09-19 MED ORDER — OXYCODONE-ACETAMINOPHEN 10-325 MG PO TABS: ORAL_TABLET | ORAL | 0 refills | Status: AC

## 2019-09-19 NOTE — Progress Notes (Signed)
Pt discharged. Infant in SCN.  Discharge instructions, prescriptions and follow up appointment given to and reviewed with pt. Pt verbalized understanding. Escorted out by auxillary.

## 2019-09-19 NOTE — Discharge Summary (Addendum)
Postpartum Discharge Summary       Patient Name: Suzanne Guzman DOB: 1991-01-02 MRN: [id number]  Date of admission: 09/12/2019 Delivery date:09/14/2019  Delivering provider: Prentice Docker D  Date of discharge: 09/19/2019  Admitting diagnosis: Elevated blood pressure affecting pregnancy in third trimester, antepartum [O16.3] Preeclampsia, third trimester [O14.93] Intrauterine pregnancy: [redacted]w[redacted]d    Secondary diagnosis:   Additional problems: Suspected cholestasis; Obesity in pregnancy, Chronic anemia    Discharge diagnosis: Preeclampsia, third trimester   [place] ([place]   Seizure ([place]: possible eclampsia   Fetal intolerance to labor, delivered, current hospitalization   Delivery by cesarean section using transverse incision of lower segment of uterus   Postpartum care following cesarean delivery   Anxiety disorder                                             Post partum procedures:Magnesium sulfate Augmentation: Pitocin and Cytotec Complications: Maternal Seizure; acute respiratory failure  Hospital course: Suzanne Michalskyis a 29year old G1 P0 AAF admitted on 09/12/2019 with severe preeclampsia at 34wk3d gestation for blood pressure control and induction of labor. She was also started on magnesium sulfate for seizure prophylaxis and vancomycin for GBS prophylaxis. Induction of labor was started with Cytotec then switched to Pitocin. On 09/14/2019 she had progressed to 2.5 cm dilation, but due to late decelerations remote from delivery, decision was made to proceed with a Cesarean section.  Her epidural initially was bolused, but pain control was inadequate. Decision by anesthesia to proceed with a spinal anesthesia. The spinal was placed without difficulty,  but then she quickly began to state that she couldn't breathe, though her O2 sats were normal. She was asking to sit up. She was reaching for her face.  Attempts were made to calm the patient.  Suddenly she  became apneic agitated with seizure like activity consistent with a tonic-clonic seizure. requiring emergent intubation, and emergent C-section.  She was successfully extubated postprocedure and transferred to the ICU for close monitoring. She was transferred back to L&D on 09/15/2019 to complete 24 hours of magnesium sulfate. She was begun on oral antihypertensives after her Cesarean section and did require IV antihypertensive medications for severe range blood pressures through her third postoperative day. Oral antihypertensives were adjusted and on POD #5 she was having low range blood pressures on labetalol 300 mgm BID and Procardia 90 mgm XL daily.  She was also ambulating, tolerating regular diet, and had normal bowel and bladder function. She was deemed stable for discharge and will be followed up on 6 July for a blood pressure and incision check.  She was also started on Lexapro and continued on Atarax prn for her anxiety.  Magnesium Sulfate received: Yes: Seizure prophylaxis/ BMZ received: Yes Rhophylac:N/A MMR:No T-DaP:Given prenatally Flu: No Transfusion:No  Physical exam  Vitals:   09/19/19 0018 09/19/19 0309 09/19/19 0813 09/19/19 1111  BP: (!) 138/91 (!) 140/91 (!) 145/95 140/84  Pulse: 98 94 93 97  Resp: _0 Temp: 99.7 F (37.6 C) 98.7 F (37.1 C) 98.9 F (37.2 C) 99 F (37.2 C)  TempSrc: Oral Oral Oral Axillary  SpO2: 99% 99% 99%   Weight:      Height:      General: BF in NAD, sitting up in bed Pulmonary: no increased work of breathing/ CTAB Heart: RRR  without murmur Abdomen: non-distended,soft, NABS, fundus firm at level of umbilicus-2FB Incision: Honeycomb dressing C&D&I Lochia: minimal Extremities: mild pedal and ankle edema, no erythema, no tenderness  Labs: Lab Results  Component Value Date   WBC 12.0 (H) 09/17/2019   HGB 8.6 (L) 09/17/2019   HCT 26.1 (L) 09/17/2019   MCV 82.6 09/17/2019   PLT 260 09/17/2019   CMP Latest Ref Rng & Units  09/14/2019  Glucose 70 - 99 mg/dL 122(H)  BUN 6 - 20 mg/dL 10  Creatinine 0.44 - 1.00 mg/dL 0.80  Sodium 135 - 145 mmol/L 135  Potassium 3.5 - 5.1 mmol/L 3.8  Chloride 98 - 111 mmol/L 105  CO2 22 - 32 mmol/L 20(L)  Calcium 8.9 - 10.3 mg/dL 7.3(L)  Total Protein 6.5 - 8.1 g/dL 6.4(L)  Total Bilirubin 0.3 - 1.2 mg/dL 0.6  Alkaline Phos 38 - 126 U/L 131(H)  AST 15 - 41 U/L 30  ALT 0 - 44 U/L 26   Edinburgh Score: Edinburgh Postnatal Depression Scale Screening Tool 09/16/2019  I have been able to laugh and see the funny side of things. 0  I have looked forward with enjoyment to things. 0  I have blamed myself unnecessarily when things went wrong. 1  I have been anxious or worried for no good reason. 2  I have felt scared or panicky for no good reason. 0  Things have been getting on top of me. 0  I have been so unhappy that I have had difficulty sleeping. 0  I have felt sad or miserable. 0  I have been so unhappy that I have been crying. 0  The thought of harming myself has occurred to me. 0  Edinburgh Postnatal Depression Scale Total 3      After visit meds:  Allergies as of 09/19/2019      Reactions   Nutmeg Oil (myristica Oil) Anaphylaxis   Allergy to NUTS   Peanut-containing Drug Products Anaphylaxis   ALL NUTS   Penicillins Anaphylaxis      Medication List    STOP taking these medications   cetirizine 10 MG tablet Commonly known as: ZYRTEC   ursodiol 500 MG tablet Commonly known as: ACTIGALL     TAKE these medications   enoxaparin 40 MG/0.4ML injection Commonly known as: LOVENOX Inject 0.4 mLs (40 mg total) into the skin daily for 21 days. Start taking on: September 20, 2019   escitalopram 5 MG tablet Commonly known as: LEXAPRO Take 0.5 tablet daily x 7 days then increase to 1 tablet daily   hydrOXYzine 25 MG tablet Commonly known as: ATARAX/VISTARIL Take 1 tablet (25 mg total) by mouth every 6 (six) hours as needed for itching or anxiety.   ibuprofen 600 MG  tablet Commonly known as: ADVIL Take 1 tablet (600 mg total) by mouth every 6 (six) hours as needed for fever or headache.   iron polysaccharides 150 MG capsule Commonly known as: NIFEREX Take 1 capsule (150 mg total) by mouth daily.   labetalol 300 MG tablet Commonly known as: NORMODYNE Take 1 tablet (300 mg total) by mouth 2 (two) times daily.   multivitamin-prenatal 27-0.8 MG Tabs tablet Take 1 tablet by mouth daily at 12 noon.   NIFEdipine 90 MG 24 hr tablet Commonly known as: PROCARDIA XL/NIFEDICAL-XL Take 1 tablet (90 mg total) by mouth daily. Start taking on: September 20, 2019   norethindrone 0.35 MG tablet Commonly known as: MICRONOR Take 1 tablet (0.35 mg total) by mouth daily. Start  taking on: October 12, 2019   omeprazole 20 MG capsule Commonly known as: PRILOSEC Take 1 capsule (20 mg total) by mouth daily.   oxyCODONE-acetaminophen 10-325 MG tablet Commonly known as: PERCOCET Take 0.5 to 1 tablet every 6 hours prn        Discharge home in stable condition Infant Feeding: Breast/ NG tube/ mother is pumping Infant Disposition:NICU Discharge instruction: per After Visit Summary and Postpartum booklet. Activity: Advance as tolerated. Pelvic rest for 6 weeks.  Diet: routine diet Anticipated Birth Control: [place] Postpartum Appointment:6 July incision and blood pressure check  Additional Postpartum F/U:Future Appointments: Future Appointments  Date Time Provider [place]  09/23/2019 11:00 AM Will Bonnet, MD [facility] None   Follow up Visit:  Follow-up Information    Will Bonnet, MD. Go on 09/23/2019.   Specialty: Obstetrics and Gynecology Why: Blood pressure and incision check on 09/23/19 @ 11:00 am Contact information: [address] [place] [address] [phone]                   09/19/2019 Dalia Heading, CNM

## 2019-09-19 NOTE — Discharge Instructions (Signed)
Breast Pumping Tips Breast pumping is a way to get milk out of your breasts. You will then store the milk for your baby to use when you are away from home. There are three ways to pump. You can:  Use your hand to massage and squeeze your breast (hand expression).  Use a hand-held machine to manually pump your milk.  Use an electric machine to pump your milk. In the beginning you may not get much milk. After a few days your breasts should make more. Pumping can help you start making milk after your baby is born. Pumping helps you to keep making milk when you are away from your baby. When should I pump? You can start pumping soon after your baby is born. Follow these tips:  When you are with your baby: ? Pump after you breastfeed. ? Pump from the free breast while you breastfeed.  When you are away from your baby: ? Pump every 2-3 hours for 15 minutes. ? Pump both breasts at the same time if you can.  If your baby drinks formula, pump around the time your baby gets the formula.  If you drank alcohol, wait 2 hours before you pump.  If you are going to have surgery, ask your doctor when you should pump again. How do I get ready to pump? Take steps to relax. Try these things to help your milk come in:  Smell your baby's blanket or clothes.  Look at a picture or video of your baby.  Sit in a quiet, private space.  Massage your breast and nipple.  Place a cloth on your breast. The cloth should be warm and a little wet.  Play relaxing music.  Picture your milk flowing. What are some tips? General tips for pumping breast milk   Always wash your hands before pumping.  If you do not get much milk or if pumping hurts, try different pump settings or a different kind of pump.  Drink enough fluid so your pee (urine) is clear or pale yellow.  Wear clothing that opens in the front or is easy to take off.  Pump milk into a clean bottle or container.  Do not use anything that has  nicotine or tobacco. Examples are cigarettes and e-cigarettes. If you need help quitting, ask your doctor. Tips for storing breast milk   Store breast milk in a clean, BPA-free container. These include: ? A glass or plastic bottle. ? A milk storage bag.  Store only 2-4 ounces of breast milk in each container.  Swirl the breast milk in the container. Do not shake it.  Write down the date you pumped the milk on the container.  This is how long you can store breast milk: ? Room temperature: 6-8 hours. It is best to use the milk within 4 hours. ? Cooler with ice packs: 24 hours. ? Refrigerator: 5-8 days, if the milk is clean. It is best to use the milk within 3 days. ? Freezer: 9-12 months, if the milk is clean and stored away from the freezer door. It is best to use the milk within 6 months.  Put milk in the back of the refrigerator or freezer.  Thaw frozen milk using warm water. Do not use the microwave. Tips for choosing a breast pump When choosing a pump, keep the following things in mind:  Manual breast pumps do not need electricity. They cost less. They can be hard to use.  Electric breast pumps use electricity. They  are more expensive. They are easier to use. They collect more milk.  The suction cup (flange) should be the right size.  Before you buy the pump, check if your insurance will pay for it. Tips for caring for a breast pump  Check the manual that came with your pump for cleaning tips.  Clean the pump after you use it. To do this: ? Wipe down the electrical part. Use a dry cloth or paper towel. Do not put this part in water or in cleaning products. ? Wash the plastic parts with soap and warm water. Or use the dishwasher if the manual says it is safe. You do not need to clean the tubing unless it touched breast milk. ? Let all the parts air dry. Avoid drying them with a cloth or towel. ? When the parts are clean and dry, put the pump back together. Then store the  pump.  If there is water in the tubing when you want to pump: 1. Attach the tubing to the pump. 2. Turn on the pump. 3. Turn off the pump when the tube is dry.  Try not to touch the inside of pump parts. Summary  Pumping can help you start making milk after your baby is born. It lets you keep making milk when you are away from your baby.  When you are away from your baby, pump for about 15 minutes every 2-3 hours. Pump both breasts at the same time, if you can. This information is not intended to replace advice given to you by your health care provider. Make sure you discuss any questions you have with your health care provider. Document Revised: 06/26/2018 Document Reviewed: 04/10/2016 Elsevier Patient Education  [employer]. Discharge Instructions:   Follow-up Appointment:   If there are any new medications, they have been ordered and will be available for pickup at the listed pharmacy on your way home from the hospital.   Call office if you have any of the following: headache, visual changes, fever >101.0 F, chills, shortness of breath, breast concerns, excessive vaginal bleeding, incision drainage or problems, leg pain or redness, depression, blood pressures 400 or more systolic (top number) or 867 or more diastolic (bottom number), or a blood pressure 90/60 or lower,  or any other concerns. If you have vaginal discharge with an odor, let your doctor know.   It is normal to bleed for up to 6 weeks. You should not soak through more than 1 pad in 1 hour. If you have a blood clot larger than your fist with continued bleeding, call your doctor.   After a c-section, you should expect a small amount of blood or clear fluid coming from the incision and abdominal cramping/soreness. Inspect your incision site daily. Stand in front of a mirror to look for any redness, incision opening, or discolored/odorness drainage. Take a shower daily and continue good hygiene. Use own towel and  washcloth (do not share). Make sure your sheets on your bed are clean. No pets sleeping around your incision site. Dressing will be removed at your postpartum visit. If the dressing does become wet or soiled underneath, it is okay to remove it.   Activity: Do not lift > 10 lbs for 6 weeks (do not lift anything heavier than your baby). No intercourse, tampons, swimming pools, hot tubs, baths (only showers) for 6 weeks.  No driving for 1-2 weeks. Continue prenatal vitamin, especially if breastfeeding. Increase calories and fluids (water) while breastfeeding.  Your milk will come in, in the next couple of days (right now it is colostrum). You may have a slight fever when your milk comes in, but it should go away on its own.  If it does not, and rises above 101 F please call the doctor. You will also feel achy and your breasts will be firm. They will also start to leak. If you are breastfeeding, continue as you have been and you can pump/express milk for comfort.   If you have too much milk, your breasts can become engorged, which could lead to mastitis. This is an infection of the milk ducts. It can be very painful and you will need to notify your doctor to obtain a prescription for antibiotics. You can also treat it with a shower or hot/cold compress.   For concerns about your baby, please call your pediatrician.  For breastfeeding concerns, the lactation consultant can be reached at [phone].   Postpartum blues (feelings of happy one minute and sad another minute) are normal for the first few weeks but if it gets worse let your doctor know.   Congratulations! We enjoyed caring for you and your new bundle of joy!

## 2019-09-19 NOTE — Lactation Note (Addendum)
This note was copied from a baby's chart. Lactation Consultation Note  Patient Name: Suzanne Guzman [id number] Date: 09/19/2019     Maternal Data  First baby for mom, pumping approx every 3 hrs, some lumpiness in both breasts at chest area on sides and upper breasts, breasts soft around areola, encouraged massage before breastfeeding attempts and pumping sessions, she has a rental Symphony pump to take home with her today, reinforced hand expression before attempting to latch baby to breast, reviewed milk expression, able to easily express drops of colostrum Mom pumped after this breastfeeding session and obtained approx, 25 cc colostrum per mom, more on right than left  Feeding Feeding Type: Breast Milk Baby fussy and arching back if not swaddled, rooting at left breast but fussy if not assisted with latch, in cradle holdtakes a while to coordinate tongue to latch, will suck a few times and let breast go, but can relatch if breast shaped for easier latch. Several bursts of sucking if breast supported and shaped for baby, occ swallow, but mostly non-nutritive sucking, attempted on right breast in football hold but baby became very fussy and inconsolable so place on mom's chest skin to skin to calm    LATCH Score Latch: Repeated attempts needed to sustain latch, nipple held in mouth throughout feeding, stimulation needed to elicit sucking reflex.  Audible Swallowing: A few with stimulation  Type of Nipple: Flat  Comfort (Breast/Nipple): Filling, red/small blisters or bruises, mild/mod discomfort  Hold (Positioning): Full assist, staff holds infant at breast  LATCH Score: 4  Interventions  Praise and reassurance that breastfeeding attempt was appropriate for age  Lactation Tools Discussed/Used     Consult Status      Ferol Luz 09/19/2019, 2:49 PM

## 2019-09-19 NOTE — Progress Notes (Signed)
POD #5 Primary CS/ Eclampsia Subjective:  Wants to go home today. Baby in [place] on feedings. Patient is pumping, breasts are a little lumpy. Working with lactation. Feels a little lightheaded after taking Percocet (2) tabs. Bleeding is minimal. No headaches or visual changes. No itching. Blood pressures a little better since increasing Procardia to 60 mgm XL, but still in mild range. Passing flatus and has had 2 BMs since surgery Objective:  Blood pressure (!) 145/95, pulse 93, temperature 98.9 F (37.2 C), temperature source Oral, resp. rate 20, height 5\' 3"  (1.6 m), weight 112.4 kg, last menstrual period 01/14/2019, SpO2 99 %, unknown if currently breastfeeding. Patient Vitals for the past 24 hrs:  BP Temp Temp src Pulse Resp SpO2  09/19/19 0813 (!) 145/95 98.9 F (37.2 C) Oral 93 20 99 %  09/19/19 0309 (!) 140/91 98.7 F (37.1 C) Oral 94 18 99 %  09/19/19 0018 (!) 138/91 99.7 F (37.6 C) Oral 98 18 99 %  09/18/19 2100 (!) 151/90 98.9 F (37.2 C) Oral 100 18 100 %  09/18/19 1458 (!) 144/85 -- -- 99 -- --  09/18/19 1456 (!) 144/85 -- -- 99 -- --  09/18/19 1249 (!) 150/93 98.6 F (37 C) Oral 98 18 100 %  09/18/19 1240 (!) 146/89 -- -- 92 -- --   General: BF in NAD, sitting up in bed Pulmonary: no increased work of breathing/ CTAB Heart: RRR without murmur Abdomen: non-distended,soft, NABS, fundus firm at level of umbilicus-2FB Incision: Honeycomb dressing C&D&I Extremities: mild pedal and ankle edema, no erythema, no tenderness  Results for orders placed or performed during the hospital encounter of 09/12/19 (from the past 72 hour(s))  CBC     Status: Abnormal   Collection Time: 09/17/19  5:21 AM  Result Value Ref Range   WBC 12.0 (H) 4.0 - 10.5 K/uL   RBC 3.16 (L) 3.87 - 5.11 MIL/uL   Hemoglobin 8.6 (L) 12.0 - 15.0 g/dL   HCT 26.1 (L) 36 - 46 %   MCV 82.6 80.0 - 100.0 fL   MCH 27.2 26.0 - 34.0 pg   MCHC 33.0 30.0 - 36.0 g/dL   RDW 13.0 11.5 - 15.5 %   Platelets 260  150 - 400 K/uL   nRBC 0.0 0.0 - 0.2 %    Comment: Performed at [facility], [address] [place], [place] [address]     Assessment:   29 y.o. G1P0101 postoperativeday # 5 Primary low transverse Cesarean section/ eclampsia    Plan:  1) Acute blood loss anemia - hemodynamically stable and asymptomatic - po iron and vitamins  2) A POS/ RI/ VI  3) TDAP-given AP  4) Breast/Bottle/Contraception-minipill  5) Eclampsia - blood pressures better with increasing Procardia to 60 mgm XL daily, but still in mild range. Will continue labetalol 300mg  po bid, and increase procardia to 90mg  daily.   - s/p 24-hr magnesium sulfate - Will discharge patient later today  6) Anxiety: Discussed history of anxiety and how she is feeling now. She feels her anxiety will be worse when she goes home. Used to take Xanax prn prior to pregnancy, but did not like the way it made her feel. Discussed starting Lexapro daily  and giving her Atarax to take prn  7) DVT PPX: has several risk factors for DVT. Will start Lovenox 40 mgm Cylinder daily x 3 weeks. RN to teach patient how to self administer at home.

## 2019-09-21 ENCOUNTER — Ambulatory Visit: Payer: Self-pay

## 2019-09-21 NOTE — Lactation Note (Signed)
This note was copied from a baby's chart. Lactation Consultation Note  Patient Name: Suzanne Guzman [id number] Date: 09/21/2019 Reason for consult: Follow-up assessment;Mother's request;Difficult latch;Primapara;NICU baby;Late-preterm 34-36.6wks;Other (Comment) (Mom seized, abrupted & Coded was in ICU - Baby coded )  Mom did a little pre pumping for a lick and learn session.  Assisted mom with positioning in modified cross cradle hold skin to skin with pillow support and nursing stool under feet.  Mom was a little nervous about putting [name] to the breast, because he was really fussy and crying when she had tried before.  Mom has large breasts and fairly small well everted nipples.  FOB encouraged her to try so he could start getting practice and associating getting full while at the breast.  [name] was sleeping in FOB's arms.  Gently aroused when given to mom.  Hand expressed and rubbed a few drops on his lips.  Lactation assisted with sandwiching the breast and keeping [name] close to mom for entire feeding.  He licked and opened his mouth a little.  He took several rhythmic sucks.  The sucks were not really strong, but mom could feel the gentle tug on the breast.  We let him go at his own pace, not forcing the feeding.  Tube/gavage feeding was started shortly into the lick and learn session.  He would intermittently take a few flutter sucks, but no strong, rhythmic sucking which is all we wanted to achieve at this lick and learn session.  The vital signs and oxygen saturations remained in the upper 90's with a couple of drops into 87 & 88 which came back up quickly when removed from the breast.  He never cried or fussed during the feeding which was a relief to mom.  Mom pumped not too long after the feeding with the Symphony pump stationed at [employer] bedside.  Mom has rented a Symphony pump for home use.  She has ordered her DEBP through her [employer], but they told her it will not be here for  probably 2 to 3 weeks.  Mom is pumping around every 3 to 4 hours and getting 4 to 4 1/2 oz total per pumping.  Replenished mom's supply of bottles and labels. Praised mom for her commitment to continue to put [name] to the breast and pump.  Encouraged mom to call lactation with any questions, concerns or assistance.     Maternal Data Formula Feeding for Exclusion: No Has patient been taught Hand Expression?: Yes Does the patient have breastfeeding experience prior to this delivery?: No (Gr1)  Feeding Feeding Type: Donor Breast Milk (Lick & learn session)  LATCH Score Latch: Repeated attempts needed to sustain latch, nipple held in mouth throughout feeding, stimulation needed to elicit sucking reflex.  Audible Swallowing: A few with stimulation  Type of Nipple: Everted at rest and after stimulation  Comfort (Breast/Nipple): Filling, red/small blisters or bruises, mild/mod discomfort  Hold (Positioning): Full assist, staff holds infant at breast  LATCH Score: 5  Interventions Interventions: Assisted with latch;Skin to skin;Breast massage;Hand express;Pre-pump if needed;Breast compression;Adjust position;Support pillows;Position options;Expressed milk;Coconut oil;DEBP  Lactation Tools Discussed/Used WIC Program: No [employer]) Pump Review: Setup, frequency, and cleaning;Milk Storage;Other (comment) Initiated by:: S.Shizuko Wojdyla,RN,BSN,IBCLC Date initiated:: 09/15/19   Consult Status Consult Status: PRN Follow-up type: Call as needed    Jarold Motto 09/21/2019, 8:15 PM

## 2019-09-23 ENCOUNTER — Ambulatory Visit (INDEPENDENT_AMBULATORY_CARE_PROVIDER_SITE_OTHER): Payer: BC Managed Care – PPO | Admitting: Obstetrics and Gynecology

## 2019-09-23 ENCOUNTER — Other Ambulatory Visit: Payer: Self-pay

## 2019-09-23 ENCOUNTER — Encounter: Payer: Self-pay | Admitting: Obstetrics and Gynecology

## 2019-09-23 ENCOUNTER — Telehealth: Payer: Self-pay

## 2019-09-23 VITALS — BP 128/84 | Ht 63.0 in | Wt 211.0 lb

## 2019-09-23 DIAGNOSIS — Z09 Encounter for follow-up examination after completed treatment for conditions other than malignant neoplasm: Secondary | ICD-10-CM

## 2019-09-23 DIAGNOSIS — O1493 Unspecified pre-eclampsia, third trimester: Secondary | ICD-10-CM

## 2019-09-23 NOTE — Progress Notes (Signed)
   Postoperative Follow-up Patient presents post op from cesarean section  9 days ago.  Subjective: She denies fever, chills, nausea and vomiting. Eating a regular diet without difficulty. The patient is not having any pain.  Activity: increasing slowly. She denies issues with her incision.   Denies HA, visual changes and RUQ pain.  Notes BPs run around 140s/80. One last night at 150/90s. However, she took her labetalol dose late.    Objective: BP 128/84   Ht 5\' 3"  (1.6 m)   Wt 211 lb (95.7 kg)   BMI 37.38 kg/m   Constitutional: Well nourished, well developed female in no acute distress.  HEENT: normal Skin: Warm and dry.  Abdomen: s, nt, nd clean, dry, intact and without erythema, induration, warmth, and tenderness Extremity: no edema   Assessment: 29 y.o. s/p cesarean section progressing well  Plan: Patient has done well after surgery with no apparent complications.  I have discussed the post-operative course to date, and the expected progress moving forward.  The patient understands what complications to be concerned about.    Activity plan: increase slowly. No lifting > 25 pounds.   Return in about 1 week (around 09/30/2019) for BP check.  Prentice Docker, MD 09/23/2019 11:22 AM

## 2019-09-23 NOTE — Telephone Encounter (Signed)
Pt called after hour nurse 09/21/19 5:10am; del 6/27 via c/s and has had some bright red bleeding that would soak a panty liner; had stopped having red blood 2d ago.  After hour nurse adv no IC until all bleeding stops; it is very common for mild bleeding and spotting to occur suddenly and intermittently during the weeks following delivery; such bleeding is not concerning if it stops after an hour or two and does not exceed two pads.  Pt had appt today c SDJ.

## 2019-09-29 ENCOUNTER — Ambulatory Visit: Payer: Self-pay

## 2019-09-29 ENCOUNTER — Telehealth: Payer: Self-pay

## 2019-09-29 NOTE — Lactation Note (Signed)
This note was copied from a baby's chart. Lactation Consultation Note  Patient Name: Suzanne Guzman [id number] Date: 09/29/2019   Mom has been bringing in good volumes of breast milk enough for [name] to get all breast milk.   [name] still has 6 two oz bottles remaining in the refrigerator.  Mom has rented a Symphony pump through [facility] until October 19, 2019  Maternal Data    Feeding Feeding Type: Bottle Fed - Breast Milk Nipple Type: Slow - flow  LATCH Score                   Interventions    Lactation Tools Discussed/Used Tools: Bottle;61F feeding tube / Syringe;Pump   Consult Status      Suzanne Guzman 09/29/2019, 6:25 PM

## 2019-09-29 NOTE — Telephone Encounter (Signed)
Pt called the after hour nurse 09/27/19 9:42am c/o 2wks pp; abd pain; had BM this am less formed than normal; had c/s; no fever; no other sxs at this time.  Adv nurse adv to be seen in 4hrs; if office closed 2nd level traige required; may need to be seen; will page on call provider; if on call can't be reached to go to [facility] or ED; bring sample of bloody urine, keep refrigerated; call if becomes worse.  Pt called after hour nurse 09/27/19 11:53am; is at [facility]; wants to speak to the on call regarding her c/s 2wks ago; area is tender and this am she has diarrhea and some abd pain.  Adv nurse adv can be tx'd at home; doesn't sound like a serious stomachache; drink clear fluids only; call back if severe pain after one hour of rest, you become worse.  [phone]  [place].

## 2019-09-30 NOTE — Telephone Encounter (Signed)
Left second msg to call and let us know how she is doing.

## 2019-09-30 NOTE — Telephone Encounter (Signed)
Pt called 10:28 calling to f/u.  Left another detailed msg to call nurse line and leave detailed msg as to how she is doing.

## 2019-10-02 ENCOUNTER — Telehealth: Payer: Self-pay

## 2019-10-02 NOTE — Telephone Encounter (Signed)
Pt called after hour nurse 10/02/19 12:07am; had c/s 09/14/19 and is having abd pain; wants to know if this is normal; states the skin above the incision is hurting and a little tender; no other sxs.  [phone]  [facility] - has appt tomorrow c SDJ.

## 2019-10-02 NOTE — Telephone Encounter (Signed)
Pt called triage line 10:23 c/o abd pain last night - mid abd - lump there - hurts when clothes touch it.  [phone]  Pt states there is lumpiness around belly button along with a sore lump; a few inches above incision skin feels raw like a sun burn; pt hasn't been in the sun.  Adv nerves are cut with a c/s and that can cause diff sensations.  As for the lump - to d/w SDJ at appt tomorrow.

## 2019-10-02 NOTE — Telephone Encounter (Signed)
Pt states her stomach hurt really bad, had diarrhea, and has felt better since.  Adv probably ate something that didn't agree with her.

## 2019-10-03 ENCOUNTER — Other Ambulatory Visit: Payer: Self-pay

## 2019-10-03 ENCOUNTER — Encounter: Payer: Self-pay | Admitting: Obstetrics and Gynecology

## 2019-10-03 ENCOUNTER — Ambulatory Visit (INDEPENDENT_AMBULATORY_CARE_PROVIDER_SITE_OTHER): Payer: BC Managed Care – PPO | Admitting: Obstetrics and Gynecology

## 2019-10-03 VITALS — BP 136/88 | Ht 63.0 in | Wt 204.0 lb

## 2019-10-03 DIAGNOSIS — Z09 Encounter for follow-up examination after completed treatment for conditions other than malignant neoplasm: Secondary | ICD-10-CM

## 2019-10-03 DIAGNOSIS — O1493 Unspecified pre-eclampsia, third trimester: Secondary | ICD-10-CM

## 2019-10-03 NOTE — Progress Notes (Signed)
   Postoperative Follow-up Patient presents post op from cesarean section  19 days ago.  Subjective: She denies fever, chills, nausea and vomiting. Eating a regular diet without difficulty. Pain occasionally above her incision (blanket rubbing against it).   Activity: normal activities of daily living. She denies issues with her incision.    She is here for a BP check. She denies HA, visual changes, and RUQ pain. She is checking her BP and is mostly getting 135/85.  She is taking her medication.   Objective: BP 136/88   Ht 5\' 3"  (1.6 m)   Wt 204 lb (92.5 kg)   BMI 36.14 kg/m   Constitutional: Well nourished, well developed female in no acute distress.  HEENT: normal Skin: Warm and dry.  Abdomen: s/ mildly ttp at the level of the skin about 2 cm above the incision clean, dry, intact and without erythema, induration, warmth, and tenderness Extremity: no edema   Assessment: 29 y.o. s/p cesarean section progressing well  Plan: Patient has done well after surgery with no apparent complications.  I have discussed the post-operative course to date, and the expected progress moving forward.  The patient understands what complications to be concerned about.    Activity plan: increase slowly  She appears to have a hyperesthesia of her skin above the incision line.  Continue to monitor.  - follow up in 2 week for BP check.   Return in about 2 weeks (around 10/17/2019) for BP check.  Prentice Docker, MD 10/03/2019 4:49 PM

## 2019-10-17 ENCOUNTER — Other Ambulatory Visit: Payer: Self-pay | Admitting: Obstetrics and Gynecology

## 2019-10-17 ENCOUNTER — Other Ambulatory Visit: Payer: Self-pay | Admitting: Certified Nurse Midwife

## 2019-10-17 MED ORDER — LABETALOL HCL 300 MG PO TABS: 300.0000 mg | ORAL_TABLET | Freq: Two times a day (BID) | ORAL | 0 refills | Status: DC

## 2019-10-17 MED ORDER — NIFEDIPINE ER OSMOTIC RELEASE 90 MG PO TB24: 90.0000 mg | ORAL_TABLET | Freq: Every day | ORAL | 0 refills | Status: DC

## 2019-10-17 MED ORDER — ESCITALOPRAM OXALATE 5 MG PO TABS: ORAL_TABLET | ORAL | 0 refills | Status: DC

## 2019-10-20 ENCOUNTER — Telehealth: Payer: Self-pay

## 2019-10-20 ENCOUNTER — Ambulatory Visit (INDEPENDENT_AMBULATORY_CARE_PROVIDER_SITE_OTHER): Payer: Self-pay | Admitting: Obstetrics and Gynecology

## 2019-10-20 ENCOUNTER — Encounter: Payer: Self-pay | Admitting: Obstetrics and Gynecology

## 2019-10-20 ENCOUNTER — Other Ambulatory Visit: Payer: Self-pay

## 2019-10-20 VITALS — BP 126/74 | Ht 63.0 in | Wt 201.0 lb

## 2019-10-20 DIAGNOSIS — O1493 Unspecified pre-eclampsia, third trimester: Secondary | ICD-10-CM

## 2019-10-20 DIAGNOSIS — F53 Postpartum depression: Secondary | ICD-10-CM

## 2019-10-20 DIAGNOSIS — Z09 Encounter for follow-up examination after completed treatment for conditions other than malignant neoplasm: Secondary | ICD-10-CM

## 2019-10-20 DIAGNOSIS — O99345 Other mental disorders complicating the puerperium: Secondary | ICD-10-CM

## 2019-10-20 MED ORDER — ESCITALOPRAM OXALATE 10 MG PO TABS: 10.0000 mg | ORAL_TABLET | Freq: Every day | ORAL | 0 refills | Status: DC

## 2019-10-20 MED ORDER — NIFEDIPINE ER OSMOTIC RELEASE 60 MG PO TB24: 60.0000 mg | ORAL_TABLET | Freq: Every day | ORAL | 1 refills | Status: AC

## 2019-10-20 NOTE — Telephone Encounter (Signed)
Pt called after hour nurse 10/17/19 11:12pm; is a month pp and says the pharm doesn't have the order for her BP medication; no current reading but says she really needs this rx called in; pharm is closing soon.  Pt also wants to know if she can have alcohol one month pp.  [phone]  Pt has appt today c SDJ.

## 2019-10-20 NOTE — Progress Notes (Signed)
   Postoperative Follow-up Patient presents post op from cesarean section  5 weeks ago.  Subjective: She denies fever, chills, nausea and vomiting. Eating a regular diet without difficulty. Denies issues with her incision.  Activity: normal activities of daily living.   She is here for a BP check. She denies HA, visual changes, and RUQ pain. She is checking her BP and is mostly getting 130/80, and mostly lower.  She is taking her medication.   She notes new onset of depression symptoms that started in the past week.  Overall, she feels hopeless. She has never had this happen before.  She has trouble falling and staying asleep.  She sleeps for around 45 minutes at a time.  When she is feeling especially down she feels as if things might be better if she weren't around. She has made no plans and does not believe she would hurt herself. She has no intention of harming herself at the moment and can vouch for safety.    Objective: BP 126/74   Ht 5\' 3"  (1.6 m)   Wt 201 lb (91.2 kg)   BMI 35.61 kg/m   Constitutional: Well nourished, well developed female in no acute distress.  HEENT: normal Skin: Warm and dry.  Abdomen: s/ mildly ttp at the level of the skin about 2 cm above the incision clean, dry, intact and without erythema, induration, warmth, and tenderness Extremity: no edema   Assessment: 29 y.o. s/p cesarean section progressing well, postpartum depression.  Plan: Patient has done well after surgery with no apparent complications.  I have discussed the post-operative course to date, and the expected progress moving forward.  The patient understands what complications to be concerned about.    Activity plan: increase slowly  Preeeclampsia: decrease Nifedipine to 60 mg daily.  Will increase dose back to 90 mg, if her BP increase significantly.  Postpartum depression: Her dose of Lexapro was increased to 10 mg.  She was provided a list of therapists/counselors in the area.  We  discussed that treatment would be optimal if there is a combination of medication and therapy. She contracts for safety today and agrees to go to the nearest ER should she develop SI/HI.  Very close follow up.    Return in about 1 week (around 10/27/2019) for BP check and PP depression medication follow up.  Prentice Docker, MD 10/20/2019 1:20 PM

## 2019-10-20 NOTE — Patient Instructions (Signed)
Therapists/Counselors/Psychologists   Karen [place], [place], Delfin Gant and Adella Nissen, [place] [phone]       [phone] [address]      [address] [place], [place] [address]      [place], [place] [address]   [place], [place], [place] [phone]       [phone] [address]     [address], [place] [address]      [place], [place] [address]   Dimas Aguas, LMFT      Miguel Dibble [phone]       [phone] [address]      [address] [place], [place] [address]      [place], [place] [address]   Wylene Simmer [phone]       [phone] [address]      [address] [place], [place] [address]      [place], [place] [address]   Cristin Bary [place], [place], [place] [phone]       [phone] [address]      [place], [place] [address]      [place], [place] [address]   Karlyn Agee [phone]       [facility] [address] [place]     [phone] [place], [place] [address]      [email or web address]     [place] [phone]       [place] [address] [place]      [phone] [place], [place] [address]      [email or web address]

## 2019-10-21 ENCOUNTER — Inpatient Hospital Stay: Admit: 2019-10-21 | Payer: BC Managed Care – PPO

## 2019-10-27 ENCOUNTER — Telehealth: Payer: Self-pay

## 2019-10-27 NOTE — Telephone Encounter (Signed)
Pt called after hour nurse 10/25/19 11:32 am c/o 6wk pp; bleeding.  [phone]  [facility]

## 2019-10-30 ENCOUNTER — Ambulatory Visit: Payer: Self-pay | Admitting: Obstetrics and Gynecology

## 2019-10-31 NOTE — Telephone Encounter (Signed)
Pt was a no show for 6wk pp appt yesterday.  Msg closed.

## 2019-11-03 ENCOUNTER — Telehealth: Payer: Self-pay

## 2019-11-03 NOTE — Telephone Encounter (Signed)
Pt called after hour nurse 11/02/19 5:47pm c/o 7wks pp; constipated. [phone]  [facility]

## 2019-11-21 ENCOUNTER — Other Ambulatory Visit: Payer: Self-pay

## 2019-11-21 DIAGNOSIS — F53 Postpartum depression: Secondary | ICD-10-CM

## 2019-11-21 NOTE — Telephone Encounter (Signed)
Pt has left msg of a diff matter since; no mention of constipation and has appt sched later this month.

## 2019-11-21 NOTE — Telephone Encounter (Signed)
Advise

## 2019-11-22 MED ORDER — ESCITALOPRAM OXALATE 10 MG PO TABS: 10.0000 mg | ORAL_TABLET | Freq: Every day | ORAL | 0 refills | Status: AC

## 2019-11-25 ENCOUNTER — Telehealth: Payer: Self-pay

## 2019-11-25 NOTE — Telephone Encounter (Signed)
Pt called the after hour nurse 11/22/19 c/o 9w pp; having a lot of bleeding after delivery; some back pain, in one hour she is going thru 1 pad the the last two days.  [phone]  [facility]

## 2019-12-02 NOTE — Telephone Encounter (Signed)
Pt hasn't returned call; has appt c SDJ 9/16th.

## 2019-12-04 ENCOUNTER — Ambulatory Visit: Payer: Self-pay | Admitting: Obstetrics and Gynecology

## 2020-01-09 ENCOUNTER — Other Ambulatory Visit: Payer: Self-pay | Admitting: Obstetrics and Gynecology

## 2020-01-09 NOTE — Telephone Encounter (Signed)
Not sure if you still have her on this or not she delivered in June by C-section with you

## 2020-02-14 ENCOUNTER — Other Ambulatory Visit: Payer: Self-pay | Admitting: Obstetrics and Gynecology

## 2020-02-14 DIAGNOSIS — R030 Elevated blood-pressure reading, without diagnosis of hypertension: Secondary | ICD-10-CM

## 2020-02-14 MED ORDER — LABETALOL HCL 300 MG PO TABS: 300.0000 mg | ORAL_TABLET | Freq: Two times a day (BID) | ORAL | 0 refills | Status: DC

## 2020-02-14 NOTE — Progress Notes (Signed)
Patient called nurse line for refill of her labetalol. Sent 30 day supply. Patient advised that she needs an appointment with PCP or our office to continue this medication outside of pregnancy if this is now a chronic issue.  Adrian Prows MD, [facility], [facility] 02/14/2020 8:12 AM

## 2020-03-01 ENCOUNTER — Encounter: Payer: Self-pay | Admitting: Obstetrics and Gynecology

## 2020-03-01 ENCOUNTER — Ambulatory Visit (INDEPENDENT_AMBULATORY_CARE_PROVIDER_SITE_OTHER): Payer: BC Managed Care – PPO | Admitting: Obstetrics and Gynecology

## 2020-03-01 ENCOUNTER — Other Ambulatory Visit: Payer: Self-pay

## 2020-03-01 VITALS — BP 144/80 | Wt 221.0 lb

## 2020-03-01 DIAGNOSIS — I10 Essential (primary) hypertension: Secondary | ICD-10-CM | POA: Diagnosis not present

## 2020-03-01 MED ORDER — LABETALOL HCL 200 MG PO TABS: 200.0000 mg | ORAL_TABLET | Freq: Every day | ORAL | 1 refills | Status: AC

## 2020-03-01 MED ORDER — LABETALOL HCL 300 MG PO TABS: 300.0000 mg | ORAL_TABLET | Freq: Two times a day (BID) | ORAL | 1 refills | Status: AC

## 2020-03-01 NOTE — Progress Notes (Signed)
Obstetrics & Gynecology Office Visit   Chief Complaint  Patient presents with  . Follow-up  Blood pressure issue  History of Present Illness: 29 y.o. G41P0101 female who delivered in June of this year (nearly 6 months ago).  Her pregnancy was complicated by preeclampsia with severe features. She was originally on nifedipine XL and labetalol.  She no longer takes nifedipine and only takes labetalol 300 mg po bid. She notes that when her dose is due.  She has had blood pressure measurement up to 151/87.    Past Medical History:  Diagnosis Date  . Anxiety   . Cholestasis during pregnancy in third trimester   . Obesity   . Wolff-Parkinson-White syndrome    treated with ablation    Past Surgical History:  Procedure Laterality Date  . CARDIAC ELECTROPHYSIOLOGY MAPPING AND ABLATION     heart  . CESAREAN SECTION N/A 09/14/2019   Procedure: CESAREAN SECTION;  Surgeon: Will Bonnet, MD;  Location: [facility] ORS;  Service: Obstetrics;  Laterality: N/A;    Gynecologic History: No LMP recorded.  Obstetric History: G1P0101  Family History  Problem Relation Age of Onset  . Cancer Mother   . Other Father        MDS    Social History   Socioeconomic History  . Marital status: Married    Spouse name: Not on file  . Number of children: Not on file  . Years of education: Not on file  . Highest education level: Not on file  Occupational History  . Not on file  Tobacco Use  . Smoking status: Never Smoker  . Smokeless tobacco: Never Used  Vaping Use  . Vaping Use: Never used  Substance and Sexual Activity  . Alcohol use: Not Currently  . Drug use: Never  . Sexual activity: Not on file  Other Topics Concern  . Not on file  Social History Narrative  . Not on file   Social Determinants of Health   Financial Resource Strain: Not on file  Food Insecurity: Not on file  Transportation Needs: Not on file  Physical Activity: Not on file  Stress: Not on file  Social Connections:  Not on file  Intimate Partner Violence: Not on file    Allergies  Allergen Reactions  . Nutmeg Oil (Myristica Oil) Anaphylaxis    Allergy to NUTS  . Peanut-Containing Drug Products Anaphylaxis    ALL NUTS  . Penicillins Anaphylaxis    Prior to Admission medications   Medication Sig Start Date End Date Taking? Authorizing Provider  labetalol (NORMODYNE) 300 MG tablet Take 1 tablet (300 mg total) by mouth 2 (two) times daily. 02/14/20   Schuman, Stefanie Libel, MD  norethindrone (MICRONOR) 0.35 MG tablet Take 1 tablet (0.35 mg total) by mouth daily. 10/12/19   Dalia Heading, CNM  Prenatal Vit-Fe Fumarate-FA (MULTIVITAMIN-PRENATAL) 27-0.8 MG TABS tablet Take 1 tablet by mouth daily at 12 noon.    [provider]    Review of Systems  Constitutional: Negative.   HENT: Negative.   Eyes: Negative.   Respiratory: Negative.   Cardiovascular: Negative.   Gastrointestinal: Negative.   Genitourinary: Negative.   Musculoskeletal: Negative.   Skin: Negative.   Neurological: Negative.   Psychiatric/Behavioral: Negative.      Physical Exam BP (!) 144/80   Wt 221 lb (100.2 kg)   BMI 39.15 kg/m  No LMP recorded. Physical Exam Constitutional:      General: She is not in acute distress.    Appearance:  Normal appearance.  HENT:     Head: Normocephalic and atraumatic.  Eyes:     General: No scleral icterus.    Conjunctiva/sclera: Conjunctivae normal.  Neurological:     General: No focal deficit present.     Mental Status: She is alert and oriented to person, place, and time.     Cranial Nerves: No cranial nerve deficit.  Psychiatric:        Mood and Affect: Mood normal.        Behavior: Behavior normal.        Judgment: Judgment normal.     Female chaperone present for pelvic and breast  portions of the physical exam  Assessment: 29 y.o. G58P0101 female here for  1. Essential hypertension      Plan: Problem List Items Addressed This Visit      Cardiovascular  and Mediastinum   Essential hypertension - Primary   Relevant Medications   labetalol (NORMODYNE) 300 MG tablet   labetalol (NORMODYNE) 200 MG tablet     Recommend seeing PCP once she has completed breast feeding for meds better long-term for hypertension. Discussed weight loss.    Prentice Docker, MD 03/01/2020 4:47 PM

## 2020-03-19 ENCOUNTER — Other Ambulatory Visit: Payer: Self-pay | Admitting: Obstetrics and Gynecology

## 2020-03-19 DIAGNOSIS — I10 Essential (primary) hypertension: Secondary | ICD-10-CM

## 2020-05-10 ENCOUNTER — Telehealth: Payer: Self-pay

## 2020-05-10 NOTE — Telephone Encounter (Signed)
Pt calling; is experiencing pain in vulva area.  [phone] Pt states she is ovulating; vulva is more achy than normal; cramping; has taken IBU without much relief; no swelling or irritation.  Adv it may take more than one dose of IBU before she feels a difference; to continue c IBU for a few more doses and if no difference to call to be seen.

## 2020-05-17 DIAGNOSIS — Z20828 Contact with and (suspected) exposure to other viral communicable diseases: Secondary | ICD-10-CM | POA: Diagnosis not present

## 2020-07-15 ENCOUNTER — Encounter: Payer: BC Managed Care – PPO | Admitting: Obstetrics and Gynecology

## 2020-07-26 ENCOUNTER — Encounter: Payer: Self-pay | Admitting: Obstetrics and Gynecology

## 2020-08-02 DIAGNOSIS — O09899 Supervision of other high risk pregnancies, unspecified trimester: Secondary | ICD-10-CM | POA: Diagnosis not present

## 2020-08-02 DIAGNOSIS — Z3A08 8 weeks gestation of pregnancy: Secondary | ICD-10-CM | POA: Diagnosis not present

## 2020-08-02 DIAGNOSIS — O3680X0 Pregnancy with inconclusive fetal viability, not applicable or unspecified: Secondary | ICD-10-CM | POA: Diagnosis not present

## 2020-08-12 DIAGNOSIS — Z3A01 Less than 8 weeks gestation of pregnancy: Secondary | ICD-10-CM | POA: Diagnosis not present

## 2020-08-12 DIAGNOSIS — O3680X0 Pregnancy with inconclusive fetal viability, not applicable or unspecified: Secondary | ICD-10-CM | POA: Diagnosis not present

## 2020-08-12 DIAGNOSIS — Z3A10 10 weeks gestation of pregnancy: Secondary | ICD-10-CM | POA: Diagnosis not present

## 2020-08-12 DIAGNOSIS — O0991 Supervision of high risk pregnancy, unspecified, first trimester: Secondary | ICD-10-CM | POA: Diagnosis not present

## 2020-09-02 DIAGNOSIS — O10011 Pre-existing essential hypertension complicating pregnancy, first trimester: Secondary | ICD-10-CM | POA: Diagnosis not present

## 2020-09-02 DIAGNOSIS — Z79899 Other long term (current) drug therapy: Secondary | ICD-10-CM | POA: Diagnosis not present

## 2020-09-02 DIAGNOSIS — O09891 Supervision of other high risk pregnancies, first trimester: Secondary | ICD-10-CM | POA: Diagnosis not present

## 2020-09-02 DIAGNOSIS — I456 Pre-excitation syndrome: Secondary | ICD-10-CM | POA: Diagnosis not present

## 2020-09-02 DIAGNOSIS — Z3A10 10 weeks gestation of pregnancy: Secondary | ICD-10-CM | POA: Diagnosis not present

## 2020-09-02 DIAGNOSIS — Z3481 Encounter for supervision of other normal pregnancy, first trimester: Secondary | ICD-10-CM | POA: Diagnosis not present

## 2020-09-02 DIAGNOSIS — E669 Obesity, unspecified: Secondary | ICD-10-CM | POA: Diagnosis not present

## 2020-09-02 DIAGNOSIS — O0991 Supervision of high risk pregnancy, unspecified, first trimester: Secondary | ICD-10-CM | POA: Diagnosis not present

## 2020-09-02 DIAGNOSIS — O99211 Obesity complicating pregnancy, first trimester: Secondary | ICD-10-CM | POA: Diagnosis not present

## 2020-09-27 DIAGNOSIS — Z3A10 10 weeks gestation of pregnancy: Secondary | ICD-10-CM | POA: Diagnosis not present

## 2020-09-27 DIAGNOSIS — K219 Gastro-esophageal reflux disease without esophagitis: Secondary | ICD-10-CM | POA: Diagnosis not present

## 2020-09-27 DIAGNOSIS — Z3A13 13 weeks gestation of pregnancy: Secondary | ICD-10-CM | POA: Diagnosis not present

## 2020-09-27 DIAGNOSIS — O0991 Supervision of high risk pregnancy, unspecified, first trimester: Secondary | ICD-10-CM | POA: Diagnosis not present

## 2020-09-27 DIAGNOSIS — Z880 Allergy status to penicillin: Secondary | ICD-10-CM | POA: Diagnosis not present

## 2020-09-27 DIAGNOSIS — Z87828 Personal history of other (healed) physical injury and trauma: Secondary | ICD-10-CM | POA: Diagnosis not present

## 2020-09-27 DIAGNOSIS — Z8751 Personal history of pre-term labor: Secondary | ICD-10-CM | POA: Diagnosis not present

## 2020-09-27 DIAGNOSIS — I456 Pre-excitation syndrome: Secondary | ICD-10-CM | POA: Diagnosis not present

## 2020-09-27 DIAGNOSIS — O99210 Obesity complicating pregnancy, unspecified trimester: Secondary | ICD-10-CM | POA: Diagnosis not present

## 2020-09-27 DIAGNOSIS — Z2839 Other underimmunization status: Secondary | ICD-10-CM | POA: Diagnosis not present

## 2020-09-27 DIAGNOSIS — O09899 Supervision of other high risk pregnancies, unspecified trimester: Secondary | ICD-10-CM | POA: Diagnosis not present

## 2020-09-27 DIAGNOSIS — Z98891 History of uterine scar from previous surgery: Secondary | ICD-10-CM | POA: Diagnosis not present

## 2020-09-27 DIAGNOSIS — Z8719 Personal history of other diseases of the digestive system: Secondary | ICD-10-CM | POA: Diagnosis not present

## 2020-09-27 DIAGNOSIS — Z8759 Personal history of other complications of pregnancy, childbirth and the puerperium: Secondary | ICD-10-CM | POA: Diagnosis not present

## 2020-11-01 DIAGNOSIS — K219 Gastro-esophageal reflux disease without esophagitis: Secondary | ICD-10-CM | POA: Diagnosis not present

## 2020-11-01 DIAGNOSIS — Z98891 History of uterine scar from previous surgery: Secondary | ICD-10-CM | POA: Diagnosis not present

## 2020-11-01 DIAGNOSIS — Z880 Allergy status to penicillin: Secondary | ICD-10-CM | POA: Diagnosis not present

## 2020-11-01 DIAGNOSIS — I456 Pre-excitation syndrome: Secondary | ICD-10-CM | POA: Diagnosis not present

## 2020-11-01 DIAGNOSIS — O0992 Supervision of high risk pregnancy, unspecified, second trimester: Secondary | ICD-10-CM | POA: Diagnosis not present

## 2020-11-01 DIAGNOSIS — Z8719 Personal history of other diseases of the digestive system: Secondary | ICD-10-CM | POA: Diagnosis not present

## 2020-11-01 DIAGNOSIS — Z3689 Encounter for other specified antenatal screening: Secondary | ICD-10-CM | POA: Diagnosis not present

## 2020-11-01 DIAGNOSIS — Z8759 Personal history of other complications of pregnancy, childbirth and the puerperium: Secondary | ICD-10-CM | POA: Diagnosis not present

## 2020-11-01 DIAGNOSIS — Z8751 Personal history of pre-term labor: Secondary | ICD-10-CM | POA: Diagnosis not present

## 2020-11-01 DIAGNOSIS — O09899 Supervision of other high risk pregnancies, unspecified trimester: Secondary | ICD-10-CM | POA: Diagnosis not present

## 2020-11-01 DIAGNOSIS — O99210 Obesity complicating pregnancy, unspecified trimester: Secondary | ICD-10-CM | POA: Diagnosis not present

## 2020-11-01 DIAGNOSIS — O99213 Obesity complicating pregnancy, third trimester: Secondary | ICD-10-CM | POA: Diagnosis not present

## 2020-11-01 DIAGNOSIS — Z3A18 18 weeks gestation of pregnancy: Secondary | ICD-10-CM | POA: Diagnosis not present

## 2020-11-01 DIAGNOSIS — O09893 Supervision of other high risk pregnancies, third trimester: Secondary | ICD-10-CM | POA: Diagnosis not present

## 2020-11-01 DIAGNOSIS — Z2839 Other underimmunization status: Secondary | ICD-10-CM | POA: Diagnosis not present

## 2020-11-03 DIAGNOSIS — Z20828 Contact with and (suspected) exposure to other viral communicable diseases: Secondary | ICD-10-CM | POA: Diagnosis not present

## 2020-11-20 DIAGNOSIS — O99212 Obesity complicating pregnancy, second trimester: Secondary | ICD-10-CM | POA: Diagnosis not present

## 2020-11-20 DIAGNOSIS — Z3A21 21 weeks gestation of pregnancy: Secondary | ICD-10-CM | POA: Diagnosis not present

## 2020-11-20 DIAGNOSIS — O99612 Diseases of the digestive system complicating pregnancy, second trimester: Secondary | ICD-10-CM | POA: Diagnosis not present

## 2020-11-20 DIAGNOSIS — L299 Pruritus, unspecified: Secondary | ICD-10-CM | POA: Diagnosis not present

## 2020-11-20 DIAGNOSIS — Z79899 Other long term (current) drug therapy: Secondary | ICD-10-CM | POA: Diagnosis not present

## 2020-11-20 DIAGNOSIS — O99712 Diseases of the skin and subcutaneous tissue complicating pregnancy, second trimester: Secondary | ICD-10-CM | POA: Diagnosis not present

## 2020-11-29 DIAGNOSIS — O99215 Obesity complicating the puerperium: Secondary | ICD-10-CM | POA: Diagnosis not present

## 2020-11-29 DIAGNOSIS — R102 Pelvic and perineal pain: Secondary | ICD-10-CM | POA: Diagnosis not present

## 2020-11-29 DIAGNOSIS — O09892 Supervision of other high risk pregnancies, second trimester: Secondary | ICD-10-CM | POA: Diagnosis not present

## 2020-11-29 DIAGNOSIS — R252 Cramp and spasm: Secondary | ICD-10-CM | POA: Diagnosis not present

## 2020-11-29 DIAGNOSIS — O99893 Other specified diseases and conditions complicating puerperium: Secondary | ICD-10-CM | POA: Diagnosis not present

## 2020-11-29 DIAGNOSIS — K219 Gastro-esophageal reflux disease without esophagitis: Secondary | ICD-10-CM | POA: Diagnosis not present

## 2020-11-29 DIAGNOSIS — O0992 Supervision of high risk pregnancy, unspecified, second trimester: Secondary | ICD-10-CM | POA: Diagnosis not present

## 2020-11-29 DIAGNOSIS — O99712 Diseases of the skin and subcutaneous tissue complicating pregnancy, second trimester: Secondary | ICD-10-CM | POA: Diagnosis not present

## 2020-11-29 DIAGNOSIS — O99212 Obesity complicating pregnancy, second trimester: Secondary | ICD-10-CM | POA: Diagnosis not present

## 2020-11-29 DIAGNOSIS — O99613 Diseases of the digestive system complicating pregnancy, third trimester: Secondary | ICD-10-CM | POA: Diagnosis not present

## 2020-11-29 DIAGNOSIS — O34211 Maternal care for low transverse scar from previous cesarean delivery: Secondary | ICD-10-CM | POA: Diagnosis not present

## 2020-11-29 DIAGNOSIS — O99013 Anemia complicating pregnancy, third trimester: Secondary | ICD-10-CM | POA: Diagnosis not present

## 2020-11-29 DIAGNOSIS — F419 Anxiety disorder, unspecified: Secondary | ICD-10-CM | POA: Diagnosis not present

## 2020-11-29 DIAGNOSIS — L299 Pruritus, unspecified: Secondary | ICD-10-CM | POA: Diagnosis not present

## 2020-11-29 DIAGNOSIS — O99012 Anemia complicating pregnancy, second trimester: Secondary | ICD-10-CM | POA: Diagnosis not present

## 2020-11-29 DIAGNOSIS — R5383 Other fatigue: Secondary | ICD-10-CM | POA: Diagnosis not present

## 2020-12-13 DIAGNOSIS — M5489 Other dorsalgia: Secondary | ICD-10-CM | POA: Diagnosis not present

## 2020-12-14 DIAGNOSIS — R609 Edema, unspecified: Secondary | ICD-10-CM | POA: Diagnosis not present

## 2021-01-04 DIAGNOSIS — O99212 Obesity complicating pregnancy, second trimester: Secondary | ICD-10-CM | POA: Diagnosis not present

## 2021-01-04 DIAGNOSIS — O99210 Obesity complicating pregnancy, unspecified trimester: Secondary | ICD-10-CM | POA: Diagnosis not present

## 2021-01-04 DIAGNOSIS — Z3A22 22 weeks gestation of pregnancy: Secondary | ICD-10-CM | POA: Diagnosis not present

## 2021-01-04 DIAGNOSIS — Z6841 Body Mass Index (BMI) 40.0 and over, adult: Secondary | ICD-10-CM | POA: Diagnosis not present

## 2021-01-04 DIAGNOSIS — Z3689 Encounter for other specified antenatal screening: Secondary | ICD-10-CM | POA: Diagnosis not present

## 2021-01-04 DIAGNOSIS — O0992 Supervision of high risk pregnancy, unspecified, second trimester: Secondary | ICD-10-CM | POA: Diagnosis not present

## 2021-01-04 DIAGNOSIS — Z3A27 27 weeks gestation of pregnancy: Secondary | ICD-10-CM | POA: Diagnosis not present

## 2021-01-04 DIAGNOSIS — O10012 Pre-existing essential hypertension complicating pregnancy, second trimester: Secondary | ICD-10-CM | POA: Diagnosis not present

## 2021-01-04 DIAGNOSIS — E669 Obesity, unspecified: Secondary | ICD-10-CM | POA: Diagnosis not present

## 2021-01-04 DIAGNOSIS — O10919 Unspecified pre-existing hypertension complicating pregnancy, unspecified trimester: Secondary | ICD-10-CM | POA: Diagnosis not present

## 2021-01-05 DIAGNOSIS — O99213 Obesity complicating pregnancy, third trimester: Secondary | ICD-10-CM | POA: Diagnosis not present

## 2021-01-05 DIAGNOSIS — O09893 Supervision of other high risk pregnancies, third trimester: Secondary | ICD-10-CM | POA: Diagnosis not present

## 2021-01-05 DIAGNOSIS — Z98891 History of uterine scar from previous surgery: Secondary | ICD-10-CM | POA: Diagnosis not present

## 2021-01-05 DIAGNOSIS — O99413 Diseases of the circulatory system complicating pregnancy, third trimester: Secondary | ICD-10-CM | POA: Diagnosis not present

## 2021-01-05 DIAGNOSIS — O99210 Obesity complicating pregnancy, unspecified trimester: Secondary | ICD-10-CM | POA: Diagnosis not present

## 2021-01-05 DIAGNOSIS — Z87828 Personal history of other (healed) physical injury and trauma: Secondary | ICD-10-CM | POA: Diagnosis not present

## 2021-01-05 DIAGNOSIS — O0992 Supervision of high risk pregnancy, unspecified, second trimester: Secondary | ICD-10-CM | POA: Diagnosis not present

## 2021-01-05 DIAGNOSIS — Z8759 Personal history of other complications of pregnancy, childbirth and the puerperium: Secondary | ICD-10-CM | POA: Diagnosis not present

## 2021-01-05 DIAGNOSIS — Z2839 Other underimmunization status: Secondary | ICD-10-CM | POA: Diagnosis not present

## 2021-01-05 DIAGNOSIS — K219 Gastro-esophageal reflux disease without esophagitis: Secondary | ICD-10-CM | POA: Diagnosis not present

## 2021-01-05 DIAGNOSIS — Z23 Encounter for immunization: Secondary | ICD-10-CM | POA: Diagnosis not present

## 2021-01-05 DIAGNOSIS — Z8751 Personal history of pre-term labor: Secondary | ICD-10-CM | POA: Diagnosis not present

## 2021-01-05 DIAGNOSIS — Z8719 Personal history of other diseases of the digestive system: Secondary | ICD-10-CM | POA: Diagnosis not present

## 2021-01-05 DIAGNOSIS — O99613 Diseases of the digestive system complicating pregnancy, third trimester: Secondary | ICD-10-CM | POA: Diagnosis not present

## 2021-01-05 DIAGNOSIS — E669 Obesity, unspecified: Secondary | ICD-10-CM | POA: Diagnosis not present

## 2021-01-05 DIAGNOSIS — I456 Pre-excitation syndrome: Secondary | ICD-10-CM | POA: Diagnosis not present

## 2021-01-05 DIAGNOSIS — O0993 Supervision of high risk pregnancy, unspecified, third trimester: Secondary | ICD-10-CM | POA: Diagnosis not present

## 2021-01-12 DIAGNOSIS — Z3A29 29 weeks gestation of pregnancy: Secondary | ICD-10-CM | POA: Diagnosis not present

## 2021-01-12 DIAGNOSIS — Z9289 Personal history of other medical treatment: Secondary | ICD-10-CM | POA: Diagnosis not present

## 2021-01-13 DIAGNOSIS — O99013 Anemia complicating pregnancy, third trimester: Secondary | ICD-10-CM | POA: Diagnosis not present

## 2021-01-13 DIAGNOSIS — O09893 Supervision of other high risk pregnancies, third trimester: Secondary | ICD-10-CM | POA: Diagnosis not present

## 2021-01-13 DIAGNOSIS — Z3689 Encounter for other specified antenatal screening: Secondary | ICD-10-CM | POA: Diagnosis not present

## 2021-01-13 DIAGNOSIS — O10013 Pre-existing essential hypertension complicating pregnancy, third trimester: Secondary | ICD-10-CM | POA: Diagnosis not present

## 2021-01-13 DIAGNOSIS — Z3A29 29 weeks gestation of pregnancy: Secondary | ICD-10-CM | POA: Diagnosis not present

## 2021-01-13 DIAGNOSIS — O99343 Other mental disorders complicating pregnancy, third trimester: Secondary | ICD-10-CM | POA: Diagnosis not present

## 2021-01-13 DIAGNOSIS — D509 Iron deficiency anemia, unspecified: Secondary | ICD-10-CM | POA: Diagnosis not present

## 2021-01-13 DIAGNOSIS — F419 Anxiety disorder, unspecified: Secondary | ICD-10-CM | POA: Diagnosis not present

## 2021-01-13 DIAGNOSIS — O99613 Diseases of the digestive system complicating pregnancy, third trimester: Secondary | ICD-10-CM | POA: Diagnosis not present

## 2021-01-13 DIAGNOSIS — K219 Gastro-esophageal reflux disease without esophagitis: Secondary | ICD-10-CM | POA: Diagnosis not present

## 2021-01-13 DIAGNOSIS — O99413 Diseases of the circulatory system complicating pregnancy, third trimester: Secondary | ICD-10-CM | POA: Diagnosis not present

## 2021-01-13 DIAGNOSIS — Z20822 Contact with and (suspected) exposure to covid-19: Secondary | ICD-10-CM | POA: Diagnosis not present

## 2021-01-13 DIAGNOSIS — R079 Chest pain, unspecified: Secondary | ICD-10-CM | POA: Diagnosis not present

## 2021-01-13 DIAGNOSIS — O0993 Supervision of high risk pregnancy, unspecified, third trimester: Secondary | ICD-10-CM | POA: Diagnosis not present

## 2021-01-13 DIAGNOSIS — R350 Frequency of micturition: Secondary | ICD-10-CM | POA: Diagnosis not present

## 2021-01-20 DIAGNOSIS — Z3A30 30 weeks gestation of pregnancy: Secondary | ICD-10-CM | POA: Diagnosis not present

## 2021-01-20 DIAGNOSIS — O368131 Decreased fetal movements, third trimester, fetus 1: Secondary | ICD-10-CM | POA: Diagnosis not present

## 2021-01-24 DIAGNOSIS — O163 Unspecified maternal hypertension, third trimester: Secondary | ICD-10-CM | POA: Diagnosis not present

## 2021-01-24 DIAGNOSIS — Z3A30 30 weeks gestation of pregnancy: Secondary | ICD-10-CM | POA: Diagnosis not present

## 2021-01-24 DIAGNOSIS — O26893 Other specified pregnancy related conditions, third trimester: Secondary | ICD-10-CM | POA: Diagnosis not present

## 2021-01-24 DIAGNOSIS — R110 Nausea: Secondary | ICD-10-CM | POA: Diagnosis not present

## 2021-01-24 DIAGNOSIS — K219 Gastro-esophageal reflux disease without esophagitis: Secondary | ICD-10-CM | POA: Diagnosis not present

## 2021-02-02 DIAGNOSIS — O99213 Obesity complicating pregnancy, third trimester: Secondary | ICD-10-CM | POA: Diagnosis not present

## 2021-02-02 DIAGNOSIS — Z6838 Body mass index (BMI) 38.0-38.9, adult: Secondary | ICD-10-CM | POA: Diagnosis not present

## 2021-02-02 DIAGNOSIS — O09899 Supervision of other high risk pregnancies, unspecified trimester: Secondary | ICD-10-CM | POA: Diagnosis not present

## 2021-02-02 DIAGNOSIS — Z8759 Personal history of other complications of pregnancy, childbirth and the puerperium: Secondary | ICD-10-CM | POA: Diagnosis not present

## 2021-02-02 DIAGNOSIS — D509 Iron deficiency anemia, unspecified: Secondary | ICD-10-CM | POA: Diagnosis not present

## 2021-02-02 DIAGNOSIS — O0993 Supervision of high risk pregnancy, unspecified, third trimester: Secondary | ICD-10-CM | POA: Diagnosis not present

## 2021-02-02 DIAGNOSIS — Z3A31 31 weeks gestation of pregnancy: Secondary | ICD-10-CM | POA: Diagnosis not present

## 2021-02-02 DIAGNOSIS — B3731 Acute candidiasis of vulva and vagina: Secondary | ICD-10-CM | POA: Diagnosis not present

## 2021-02-02 DIAGNOSIS — Z3689 Encounter for other specified antenatal screening: Secondary | ICD-10-CM | POA: Diagnosis not present

## 2021-02-02 DIAGNOSIS — O99019 Anemia complicating pregnancy, unspecified trimester: Secondary | ICD-10-CM | POA: Diagnosis not present

## 2021-02-02 DIAGNOSIS — O10013 Pre-existing essential hypertension complicating pregnancy, third trimester: Secondary | ICD-10-CM | POA: Diagnosis not present

## 2021-02-02 DIAGNOSIS — E669 Obesity, unspecified: Secondary | ICD-10-CM | POA: Diagnosis not present

## 2021-02-02 DIAGNOSIS — O10919 Unspecified pre-existing hypertension complicating pregnancy, unspecified trimester: Secondary | ICD-10-CM | POA: Diagnosis not present

## 2021-02-02 DIAGNOSIS — Z2839 Other underimmunization status: Secondary | ICD-10-CM | POA: Diagnosis not present

## 2021-02-02 DIAGNOSIS — Z3A32 32 weeks gestation of pregnancy: Secondary | ICD-10-CM | POA: Diagnosis not present

## 2021-02-02 DIAGNOSIS — O99210 Obesity complicating pregnancy, unspecified trimester: Secondary | ICD-10-CM | POA: Diagnosis not present

## 2021-02-07 DIAGNOSIS — Z6839 Body mass index (BMI) 39.0-39.9, adult: Secondary | ICD-10-CM | POA: Diagnosis not present

## 2021-02-07 DIAGNOSIS — O10013 Pre-existing essential hypertension complicating pregnancy, third trimester: Secondary | ICD-10-CM | POA: Diagnosis not present

## 2021-02-07 DIAGNOSIS — E669 Obesity, unspecified: Secondary | ICD-10-CM | POA: Diagnosis not present

## 2021-02-07 DIAGNOSIS — O0993 Supervision of high risk pregnancy, unspecified, third trimester: Secondary | ICD-10-CM | POA: Diagnosis not present

## 2021-02-07 DIAGNOSIS — O10919 Unspecified pre-existing hypertension complicating pregnancy, unspecified trimester: Secondary | ICD-10-CM | POA: Diagnosis not present

## 2021-02-07 DIAGNOSIS — O99213 Obesity complicating pregnancy, third trimester: Secondary | ICD-10-CM | POA: Diagnosis not present

## 2021-02-07 DIAGNOSIS — Z3689 Encounter for other specified antenatal screening: Secondary | ICD-10-CM | POA: Diagnosis not present

## 2021-02-07 DIAGNOSIS — Z3A35 35 weeks gestation of pregnancy: Secondary | ICD-10-CM | POA: Diagnosis not present

## 2021-02-07 DIAGNOSIS — O10913 Unspecified pre-existing hypertension complicating pregnancy, third trimester: Secondary | ICD-10-CM | POA: Diagnosis not present

## 2021-02-07 DIAGNOSIS — O99210 Obesity complicating pregnancy, unspecified trimester: Secondary | ICD-10-CM | POA: Diagnosis not present

## 2021-02-07 DIAGNOSIS — Z3A32 32 weeks gestation of pregnancy: Secondary | ICD-10-CM | POA: Diagnosis not present

## 2021-02-07 DIAGNOSIS — O09293 Supervision of pregnancy with other poor reproductive or obstetric history, third trimester: Secondary | ICD-10-CM | POA: Diagnosis not present

## 2021-02-17 DIAGNOSIS — E669 Obesity, unspecified: Secondary | ICD-10-CM | POA: Diagnosis not present

## 2021-02-17 DIAGNOSIS — O0993 Supervision of high risk pregnancy, unspecified, third trimester: Secondary | ICD-10-CM | POA: Diagnosis not present

## 2021-02-17 DIAGNOSIS — O99210 Obesity complicating pregnancy, unspecified trimester: Secondary | ICD-10-CM | POA: Diagnosis not present

## 2021-02-17 DIAGNOSIS — Z2839 Other underimmunization status: Secondary | ICD-10-CM | POA: Diagnosis not present

## 2021-02-17 DIAGNOSIS — D509 Iron deficiency anemia, unspecified: Secondary | ICD-10-CM | POA: Diagnosis not present

## 2021-02-17 DIAGNOSIS — Z3A32 32 weeks gestation of pregnancy: Secondary | ICD-10-CM | POA: Diagnosis not present

## 2021-02-17 DIAGNOSIS — Z8759 Personal history of other complications of pregnancy, childbirth and the puerperium: Secondary | ICD-10-CM | POA: Diagnosis not present

## 2021-02-17 DIAGNOSIS — O10013 Pre-existing essential hypertension complicating pregnancy, third trimester: Secondary | ICD-10-CM | POA: Diagnosis not present

## 2021-02-17 DIAGNOSIS — O99213 Obesity complicating pregnancy, third trimester: Secondary | ICD-10-CM | POA: Diagnosis not present

## 2021-02-17 DIAGNOSIS — I456 Pre-excitation syndrome: Secondary | ICD-10-CM | POA: Diagnosis not present

## 2021-02-17 DIAGNOSIS — Z3A34 34 weeks gestation of pregnancy: Secondary | ICD-10-CM | POA: Diagnosis not present

## 2021-02-17 DIAGNOSIS — O10919 Unspecified pre-existing hypertension complicating pregnancy, unspecified trimester: Secondary | ICD-10-CM | POA: Diagnosis not present

## 2021-02-17 DIAGNOSIS — O99019 Anemia complicating pregnancy, unspecified trimester: Secondary | ICD-10-CM | POA: Diagnosis not present

## 2021-02-17 DIAGNOSIS — O09899 Supervision of other high risk pregnancies, unspecified trimester: Secondary | ICD-10-CM | POA: Diagnosis not present

## 2021-02-17 DIAGNOSIS — K219 Gastro-esophageal reflux disease without esophagitis: Secondary | ICD-10-CM | POA: Diagnosis not present

## 2021-02-17 DIAGNOSIS — Z3689 Encounter for other specified antenatal screening: Secondary | ICD-10-CM | POA: Diagnosis not present

## 2021-02-22 DIAGNOSIS — D62 Acute posthemorrhagic anemia: Secondary | ICD-10-CM | POA: Diagnosis not present

## 2021-02-22 DIAGNOSIS — O133 Gestational [pregnancy-induced] hypertension without significant proteinuria, third trimester: Secondary | ICD-10-CM | POA: Diagnosis not present

## 2021-02-22 DIAGNOSIS — Q826 Congenital sacral dimple: Secondary | ICD-10-CM | POA: Diagnosis not present

## 2021-02-22 DIAGNOSIS — Z3A35 35 weeks gestation of pregnancy: Secondary | ICD-10-CM | POA: Diagnosis not present

## 2021-02-22 DIAGNOSIS — Z79899 Other long term (current) drug therapy: Secondary | ICD-10-CM | POA: Diagnosis not present

## 2021-02-22 DIAGNOSIS — Z3A34 34 weeks gestation of pregnancy: Secondary | ICD-10-CM | POA: Diagnosis not present

## 2021-02-22 DIAGNOSIS — O9903 Anemia complicating the puerperium: Secondary | ICD-10-CM | POA: Diagnosis not present

## 2021-02-22 DIAGNOSIS — O99344 Other mental disorders complicating childbirth: Secondary | ICD-10-CM | POA: Diagnosis not present

## 2021-02-22 DIAGNOSIS — F419 Anxiety disorder, unspecified: Secondary | ICD-10-CM | POA: Diagnosis not present

## 2021-02-22 DIAGNOSIS — Z880 Allergy status to penicillin: Secondary | ICD-10-CM | POA: Diagnosis not present

## 2021-02-22 DIAGNOSIS — Z91018 Allergy to other foods: Secondary | ICD-10-CM | POA: Diagnosis not present

## 2021-02-22 DIAGNOSIS — B079 Viral wart, unspecified: Secondary | ICD-10-CM | POA: Diagnosis not present

## 2021-02-22 DIAGNOSIS — Z20822 Contact with and (suspected) exposure to covid-19: Secondary | ICD-10-CM | POA: Diagnosis not present

## 2021-02-22 DIAGNOSIS — R0789 Other chest pain: Secondary | ICD-10-CM | POA: Diagnosis not present

## 2021-02-22 DIAGNOSIS — O9852 Other viral diseases complicating childbirth: Secondary | ICD-10-CM | POA: Diagnosis not present

## 2021-02-22 DIAGNOSIS — D225 Melanocytic nevi of trunk: Secondary | ICD-10-CM | POA: Diagnosis not present

## 2021-02-22 DIAGNOSIS — Z91010 Allergy to peanuts: Secondary | ICD-10-CM | POA: Diagnosis not present

## 2021-02-22 DIAGNOSIS — O114 Pre-existing hypertension with pre-eclampsia, complicating childbirth: Secondary | ICD-10-CM | POA: Diagnosis not present

## 2021-02-22 DIAGNOSIS — E669 Obesity, unspecified: Secondary | ICD-10-CM | POA: Diagnosis not present

## 2021-02-22 DIAGNOSIS — O34211 Maternal care for low transverse scar from previous cesarean delivery: Secondary | ICD-10-CM | POA: Diagnosis not present

## 2021-02-22 DIAGNOSIS — O99214 Obesity complicating childbirth: Secondary | ICD-10-CM | POA: Diagnosis not present

## 2021-02-22 DIAGNOSIS — O1002 Pre-existing essential hypertension complicating childbirth: Secondary | ICD-10-CM | POA: Diagnosis not present

## 2021-02-22 DIAGNOSIS — O1092 Unspecified pre-existing hypertension complicating childbirth: Secondary | ICD-10-CM | POA: Diagnosis not present

## 2021-02-28 DIAGNOSIS — Q826 Congenital sacral dimple: Secondary | ICD-10-CM | POA: Diagnosis not present

## 2021-02-28 DIAGNOSIS — Z713 Dietary counseling and surveillance: Secondary | ICD-10-CM | POA: Diagnosis not present

## 2021-02-28 DIAGNOSIS — Z00121 Encounter for routine child health examination with abnormal findings: Secondary | ICD-10-CM | POA: Diagnosis not present

## 2021-03-02 DIAGNOSIS — I10 Essential (primary) hypertension: Secondary | ICD-10-CM | POA: Diagnosis not present

## 2021-03-02 DIAGNOSIS — O1003 Pre-existing essential hypertension complicating the puerperium: Secondary | ICD-10-CM | POA: Diagnosis not present

## 2021-03-02 DIAGNOSIS — O115 Pre-existing hypertension with pre-eclampsia, complicating the puerperium: Secondary | ICD-10-CM | POA: Diagnosis not present

## 2021-03-02 DIAGNOSIS — O1495 Unspecified pre-eclampsia, complicating the puerperium: Secondary | ICD-10-CM | POA: Diagnosis not present

## 2021-03-07 DIAGNOSIS — O900 Disruption of cesarean delivery wound: Secondary | ICD-10-CM | POA: Diagnosis not present

## 2021-03-09 DIAGNOSIS — N939 Abnormal uterine and vaginal bleeding, unspecified: Secondary | ICD-10-CM | POA: Diagnosis not present

## 2021-03-09 DIAGNOSIS — O4693 Antepartum hemorrhage, unspecified, third trimester: Secondary | ICD-10-CM | POA: Diagnosis not present

## 2021-03-09 DIAGNOSIS — Z3A35 35 weeks gestation of pregnancy: Secondary | ICD-10-CM | POA: Diagnosis not present

## 2021-03-24 DIAGNOSIS — Z98891 History of uterine scar from previous surgery: Secondary | ICD-10-CM | POA: Diagnosis not present

## 2021-03-24 DIAGNOSIS — O10919 Unspecified pre-existing hypertension complicating pregnancy, unspecified trimester: Secondary | ICD-10-CM | POA: Diagnosis not present

## 2021-04-06 DIAGNOSIS — Z01419 Encounter for gynecological examination (general) (routine) without abnormal findings: Secondary | ICD-10-CM | POA: Diagnosis not present

## 2021-04-06 DIAGNOSIS — M6208 Separation of muscle (nontraumatic), other site: Secondary | ICD-10-CM | POA: Diagnosis not present

## 2021-04-06 DIAGNOSIS — R300 Dysuria: Secondary | ICD-10-CM | POA: Diagnosis not present

## 2021-04-17 DIAGNOSIS — R519 Headache, unspecified: Secondary | ICD-10-CM | POA: Diagnosis not present

## 2021-04-17 DIAGNOSIS — R6883 Chills (without fever): Secondary | ICD-10-CM | POA: Diagnosis not present

## 2021-04-17 DIAGNOSIS — N644 Mastodynia: Secondary | ICD-10-CM | POA: Diagnosis not present

## 2021-04-17 DIAGNOSIS — J45909 Unspecified asthma, uncomplicated: Secondary | ICD-10-CM | POA: Diagnosis not present

## 2021-04-17 DIAGNOSIS — I456 Pre-excitation syndrome: Secondary | ICD-10-CM | POA: Diagnosis not present

## 2021-05-13 DIAGNOSIS — Z0000 Encounter for general adult medical examination without abnormal findings: Secondary | ICD-10-CM | POA: Diagnosis not present

## 2021-05-13 DIAGNOSIS — Z3009 Encounter for other general counseling and advice on contraception: Secondary | ICD-10-CM | POA: Diagnosis not present

## 2021-05-13 DIAGNOSIS — F410 Panic disorder [episodic paroxysmal anxiety] without agoraphobia: Secondary | ICD-10-CM | POA: Diagnosis not present

## 2021-05-13 DIAGNOSIS — Z8759 Personal history of other complications of pregnancy, childbirth and the puerperium: Secondary | ICD-10-CM | POA: Diagnosis not present

## 2021-05-13 DIAGNOSIS — E669 Obesity, unspecified: Secondary | ICD-10-CM | POA: Diagnosis not present

## 2021-05-13 DIAGNOSIS — F411 Generalized anxiety disorder: Secondary | ICD-10-CM | POA: Diagnosis not present

## 2021-05-13 DIAGNOSIS — I10 Essential (primary) hypertension: Secondary | ICD-10-CM | POA: Diagnosis not present

## 2021-07-11 ENCOUNTER — Ambulatory Visit
Admission: EM | Admit: 2021-07-11 | Discharge: 2021-07-11 | Disposition: A | Discharge status: Home / Self Care | Payer: BC Managed Care – PPO | Attending: Internal Medicine | Admitting: Internal Medicine

## 2021-07-11 DIAGNOSIS — R102 Pelvic and perineal pain: Secondary | ICD-10-CM | POA: Insufficient documentation

## 2021-07-11 DIAGNOSIS — R82998 Other abnormal findings in urine: Secondary | ICD-10-CM | POA: Diagnosis not present

## 2021-07-11 LAB — COMPREHENSIVE METABOLIC PANEL
ALT: 26 U/L (ref 0–44)
AST: 23 U/L (ref 15–41)
Albumin: 4.2 g/dL (ref 3.5–5.0)
Alkaline Phosphatase: 97 U/L (ref 38–126)
Anion gap: 7 (ref 5–15)
BUN: 11 mg/dL (ref 6–20)
CO2: 24 mmol/L (ref 22–32)
Calcium: 8.8 mg/dL — ABNORMAL LOW (ref 8.9–10.3)
Chloride: 102 mmol/L (ref 98–111)
Creatinine, Ser: 0.76 mg/dL (ref 0.44–1.00)
GFR, Estimated: >60 mL/min (ref >60)
Glucose, Bld: 107 mg/dL — ABNORMAL HIGH (ref 70–99)
Potassium: 3.5 mmol/L (ref 3.5–5.1)
Sodium: 133 mmol/L — ABNORMAL LOW (ref 135–145)
Total Bilirubin: 0.4 mg/dL (ref 0.3–1.2)
Total Protein: 8.1 g/dL (ref 6.5–8.1)

## 2021-07-11 LAB — URINALYSIS, ROUTINE W REFLEX MICROSCOPIC
Bilirubin Urine: NEGATIVE
Glucose, UA: NEGATIVE mg/dL
Hgb urine dipstick: NEGATIVE
Ketones, ur: NEGATIVE mg/dL
Leukocytes,Ua: NEGATIVE
Nitrite: NEGATIVE
Protein, ur: NEGATIVE mg/dL
Specific Gravity, Urine: <1.005 — ABNORMAL LOW (ref 1.005–1.030)
pH: 6 (ref 5.0–8.0)

## 2021-07-11 NOTE — ED Triage Notes (Signed)
Pt states she has been noticing her urine is "orange" in the morning. Some mild pressure in low abd/bladder region and right low back pain in flank area.  ?

## 2021-07-11 NOTE — ED Provider Notes (Signed)
?[facility] ? ? ? ?CSN: [id number] ?Arrival date & time: 07/11/21  1714 ? ? ?  ? ?History   ?Chief Complaint ?Chief Complaint  ?Patient presents with  ? urinary problem  ? ? ?HPI ?Suzanne Guzman is a 31 y.o. female presenting for 3-day history of orange-colored urine in the morning.  Also reports some suprapubic pressure and pelvic cramping as well as lower back pain.  Patient says she is about start her period so these pains are not out of the ordinary and are not severe.  She says she has been increasing her fluid intake drastically since she knows the orange urine.  She says during the day the urine becomes lighter colored and more normal-appearing.  She says it is basically clear now.  She is drinking water in exam room.  No associated fever, dysuria, urinary frequency or urgency.  Denies discolored stools.  Denies taking any supplements or vitamins.  Denies taking over-the-counter AZO.  Reports she had some carrots and she knows but not to an excessive amount so she does not think that could be causing it.  She denies any history of liver issues and cannot think of anything else that could have caused discolored urine.  No other complaints. ? ?HPI ? ?Past Medical History:  ?Diagnosis Date  ? Anxiety   ? Cholestasis during pregnancy in third trimester   ? Obesity   ? Wolff-Parkinson-White syndrome   ? treated with ablation  ? ? ?Patient Active Problem List  ? Diagnosis Date Noted  ? Essential hypertension 03/01/2020  ? Fetal intolerance to labor, delivered, current hospitalization 09/19/2019  ? Delivery by cesarean section using transverse incision of lower segment of uterus 09/19/2019  ? Anxiety disorder 09/19/2019  ? Encounter for care or examination of lactating mother 09/17/2019  ? Acute respiratory failure ([place])   ? Seizure ([place])   ? Preeclampsia, third trimester 09/12/2019  ? Pain of left calf 07/25/2019  ? Gastroesophageal reflux disease   ? ? ?Past Surgical History:  ?Procedure  Laterality Date  ? CARDIAC ELECTROPHYSIOLOGY MAPPING AND ABLATION    ? heart  ? CESAREAN SECTION N/A 09/14/2019  ? Procedure: CESAREAN SECTION;  Surgeon: Will Bonnet, MD;  Location: [facility] ORS;  Service: Obstetrics;  Laterality: N/A;  ? ? ?OB History   ? ? Gravida  ?1  ? Para  ?1  ? Term  ?   ? Preterm  ?1  ? AB  ?0  ? Living  ?1  ?  ? ? SAB  ?   ? IAB  ?   ? Ectopic  ?   ? Multiple  ?0  ? Live Births  ?1  ?   ?  ?  ? ? ? ?Home Medications   ? ?Prior to Admission medications   ?Medication Sig Start Date End Date Taking? Authorizing Provider  ?escitalopram (LEXAPRO) 10 MG tablet Take 1 tablet (10 mg total) by mouth daily. 11/22/19  Yes Will Bonnet, MD  ?hydrOXYzine (ATARAX/VISTARIL) 25 MG tablet Take 1 tablet (25 mg total) by mouth every 6 (six) hours as needed for itching or anxiety. 09/19/19  Yes Dalia Heading, CNM  ?ibuprofen (ADVIL) 600 MG tablet Take 1 tablet (600 mg total) by mouth every 6 (six) hours as needed for fever or headache. 09/19/19  Yes Dalia Heading, CNM  ?iron polysaccharides (NIFEREX) 150 MG capsule Take 1 capsule (150 mg total) by mouth daily. 09/19/19  Yes Dalia Heading, CNM  ?labetalol (NORMODYNE) 200 MG  tablet Take 1 tablet (200 mg total) by mouth daily. Take mid day between doses of 300 mg 03/01/20  Yes Will Bonnet, MD  ?labetalol (NORMODYNE) 300 MG tablet Take 1 tablet (300 mg total) by mouth 2 (two) times daily. 03/01/20  Yes Will Bonnet, MD  ?NIFEdipine (PROCARDIA XL) 60 MG 24 hr tablet Take 1 tablet (60 mg total) by mouth daily. 10/20/19  Yes Will Bonnet, MD  ?norethindrone (MICRONOR) 0.35 MG tablet Take 1 tablet (0.35 mg total) by mouth daily. 10/12/19  Yes Dalia Heading, CNM  ?omeprazole (PRILOSEC) 20 MG capsule Take 1 capsule (20 mg total) by mouth daily. 09/19/19  Yes Dalia Heading, CNM  ?oxyCODONE-acetaminophen (PERCOCET) 10-325 MG tablet Take 0.5 to 1 tablet every 6 hours prn 09/19/19  Yes Dalia Heading, CNM  ?Prenatal Vit-Fe  Fumarate-FA (MULTIVITAMIN-PRENATAL) 27-0.8 MG TABS tablet Take 1 tablet by mouth daily at 12 noon.   Yes [provider]  ?enoxaparin (LOVENOX) 40 MG/0.4ML injection Inject 0.4 mLs (40 mg total) into the skin daily for 21 days. 09/20/19 10/11/19  Dalia Heading, CNM  ? ? ?Family History ?Family History  ?Problem Relation Age of Onset  ? Cancer Mother   ? Other Father   ?     MDS  ? ? ?Social History ?Social History  ? ?Tobacco Use  ? Smoking status: Never  ? Smokeless tobacco: Never  ?Vaping Use  ? Vaping Use: Never used  ?Substance Use Topics  ? Alcohol use: Not Currently  ? Drug use: Never  ? ? ? ?Allergies   ?Nutmeg oil (myristica oil), Other, Peanut-containing drug products, and Penicillins ? ? ?Review of Systems ?Review of Systems  ?Constitutional:  Negative for chills and fever.  ?Gastrointestinal:  Positive for abdominal pain. Negative for diarrhea, nausea and vomiting.  ?Genitourinary:  Negative for decreased urine volume, dysuria, flank pain, frequency, hematuria, pelvic pain, urgency, vaginal bleeding, vaginal discharge and vaginal pain.  ?Musculoskeletal:  Positive for back pain.  ?Skin:  Negative for rash.  ? ? ?Physical Exam ?Triage Vital Signs ?ED Triage Vitals  ?Enc Vitals Group  ?   BP --   ?   Pulse --   ?   Resp --   ?   Temp --   ?   Temp src --   ?   SpO2 --   ?   Weight 07/11/21 1727 202 lb (91.6 kg)  ?   Height 07/11/21 1727 '5\' 3"'$  (1.6 m)  ?   Head Circumference --   ?   Peak Flow --   ?   Pain Score 07/11/21 1726 3  ?   Pain Loc --   ?   Pain Edu? --   ?   Excl. in [place]? --   ? ?No data found. ? ?Updated Vital Signs ?BP 137/83 (BP Location: Left Arm)   Pulse 77   Temp 98.3 ?F (36.8 ?C) (Oral)   Resp 16   Ht '5\' 3"'$  (1.6 m)   Wt 202 lb (91.6 kg)   SpO2 100%   Breastfeeding Yes   BMI 35.78 kg/m?  ?  ? ?Physical Exam ?Vitals and nursing note reviewed.  ?Constitutional:   ?   General: She is not in acute distress. ?   Appearance: Normal appearance. She is not ill-appearing or  toxic-appearing.  ?HENT:  ?   Head: Normocephalic and atraumatic.  ?Eyes:  ?   General: No scleral icterus.    ?   Right eye: No  discharge.     ?   Left eye: No discharge.  ?   Conjunctiva/sclera: Conjunctivae normal.  ?Cardiovascular:  ?   Rate and Rhythm: Normal rate and regular rhythm.  ?   Heart sounds: Normal heart sounds.  ?Pulmonary:  ?   Effort: Pulmonary effort is normal. No respiratory distress.  ?   Breath sounds: Normal breath sounds.  ?Abdominal:  ?   Palpations: Abdomen is soft.  ?   Tenderness: There is no abdominal tenderness. There is no right CVA tenderness or left CVA tenderness.  ?Musculoskeletal:  ?   Cervical back: Neck supple.  ?Skin: ?   General: Skin is dry.  ?Neurological:  ?   General: No focal deficit present.  ?   Mental Status: She is alert. Mental status is at baseline.  ?   Motor: No weakness.  ?   Gait: Gait normal.  ?Psychiatric:     ?   Mood and Affect: Mood normal.     ?   Behavior: Behavior normal.     ?   Thought Content: Thought content normal.  ? ? ? ?UC Treatments / Results  ?Labs ?(all labs ordered are listed, but only abnormal results are displayed) ?Labs Reviewed  ?URINALYSIS, ROUTINE W REFLEX MICROSCOPIC - Abnormal; Notable for the following components:  ?    Result Value  ? Color, Urine STRAW (*)   ? APPearance HAZY (*)   ? Specific Gravity, Urine <1.005 (*)   ? All other components within normal limits  ?URINE CULTURE  ?COMPREHENSIVE METABOLIC PANEL  ? ? ?EKG ? ? ?Radiology ?No results found. ? ?Procedures ?Procedures (including critical care time) ? ?Medications Ordered in UC ?Medications - No data to display ? ?Initial Impression / Assessment and Plan / UC Course  ?I have reviewed the triage vital signs and the nursing notes. ? ?Pertinent labs & imaging results that were available during my care of the patient were reviewed by me and considered in my medical decision making (see chart for details). ? ?31 year old female presenting for orange-colored urine x3 days.   Urine is orange in coloration only in the morning.  Has some lower abdominal cramping and backache but says she should be starting her period in the next 1 week.  Has had irregular periods since having her child 4 to 5 months ago.  S

## 2021-07-11 NOTE — Discharge Instructions (Addendum)
-  The urinalysis is normal other than the fact that you have over hydrated.  I will send urine for culture and if bacteria grows, we will call you and then send antibiotics. ?- At the potential causes of orange color urine could be dehydration as we discussed potential supplements and vitamins but you are not taking any of those.  Additionally, sometimes orange-colored drinks and food could cause your urine to be discolored.  We are checking labs to assess your liver function.  Sometimes liver conditions can cause discolored urine but you do not have any other signs of liver disease.  Depending on the results, may advise you to follow-up with PCP or emergency department. ? ?

## 2021-07-12 DIAGNOSIS — K219 Gastro-esophageal reflux disease without esophagitis: Secondary | ICD-10-CM | POA: Diagnosis not present

## 2021-07-12 DIAGNOSIS — J45909 Unspecified asthma, uncomplicated: Secondary | ICD-10-CM | POA: Diagnosis not present

## 2021-07-12 DIAGNOSIS — I456 Pre-excitation syndrome: Secondary | ICD-10-CM | POA: Diagnosis not present

## 2021-07-12 DIAGNOSIS — I10 Essential (primary) hypertension: Secondary | ICD-10-CM | POA: Diagnosis not present

## 2021-07-12 DIAGNOSIS — K5900 Constipation, unspecified: Secondary | ICD-10-CM | POA: Diagnosis not present

## 2021-07-12 DIAGNOSIS — R059 Cough, unspecified: Secondary | ICD-10-CM | POA: Diagnosis not present

## 2021-07-12 DIAGNOSIS — Z6835 Body mass index (BMI) 35.0-35.9, adult: Secondary | ICD-10-CM | POA: Diagnosis not present

## 2021-07-12 DIAGNOSIS — Z79899 Other long term (current) drug therapy: Secondary | ICD-10-CM | POA: Diagnosis not present

## 2021-07-12 DIAGNOSIS — M546 Pain in thoracic spine: Secondary | ICD-10-CM | POA: Diagnosis not present

## 2021-07-12 DIAGNOSIS — E860 Dehydration: Secondary | ICD-10-CM | POA: Diagnosis not present

## 2021-07-12 DIAGNOSIS — R079 Chest pain, unspecified: Secondary | ICD-10-CM | POA: Diagnosis not present

## 2021-07-12 DIAGNOSIS — M549 Dorsalgia, unspecified: Secondary | ICD-10-CM | POA: Diagnosis not present

## 2021-07-12 DIAGNOSIS — X58XXXA Exposure to other specified factors, initial encounter: Secondary | ICD-10-CM | POA: Diagnosis not present

## 2021-07-12 DIAGNOSIS — S39012A Strain of muscle, fascia and tendon of lower back, initial encounter: Secondary | ICD-10-CM | POA: Diagnosis not present

## 2021-07-13 LAB — URINE CULTURE

## 2021-07-14 DIAGNOSIS — S39012A Strain of muscle, fascia and tendon of lower back, initial encounter: Secondary | ICD-10-CM | POA: Diagnosis not present

## 2021-08-01 ENCOUNTER — Emergency Department: Payer: BC Managed Care – PPO

## 2021-08-01 ENCOUNTER — Emergency Department
Admission: EM | Admit: 2021-08-01 | Discharge: 2021-08-01 | Disposition: A | Discharge status: Home / Self Care | Payer: BC Managed Care – PPO | Attending: Emergency Medicine | Admitting: Emergency Medicine

## 2021-08-01 DIAGNOSIS — R58 Hemorrhage, not elsewhere classified: Secondary | ICD-10-CM | POA: Diagnosis not present

## 2021-08-01 DIAGNOSIS — Z79899 Other long term (current) drug therapy: Secondary | ICD-10-CM | POA: Insufficient documentation

## 2021-08-01 DIAGNOSIS — S6992XA Unspecified injury of left wrist, hand and finger(s), initial encounter: Secondary | ICD-10-CM | POA: Diagnosis not present

## 2021-08-01 DIAGNOSIS — I10 Essential (primary) hypertension: Secondary | ICD-10-CM | POA: Insufficient documentation

## 2021-08-01 DIAGNOSIS — S61012A Laceration without foreign body of left thumb without damage to nail, initial encounter: Secondary | ICD-10-CM | POA: Diagnosis not present

## 2021-08-01 DIAGNOSIS — Y93G1 Activity, food preparation and clean up: Secondary | ICD-10-CM | POA: Diagnosis not present

## 2021-08-01 DIAGNOSIS — W25XXXA Contact with sharp glass, initial encounter: Secondary | ICD-10-CM | POA: Insufficient documentation

## 2021-08-01 HISTORY — DX: Essential (primary) hypertension: I10

## 2021-08-01 NOTE — Discharge Instructions (Signed)
You may alternate Tylenol 1000 mg every 6 hours as needed for pain, fever and Ibuprofen 800 mg every 6-8 hours as needed for pain, fever.  Please take Ibuprofen with food.  Do not take more than 4000 mg of Tylenol (acetaminophen) in a 24 hour period. ? ? ?Your sutures are absorbable and will not need to be removed and will come out on their own.  You may clean your wound gently with warm soap and water 1-2 times a day and apply over-the-counter Neosporin and a bandage. ?

## 2021-08-01 NOTE — ED Notes (Signed)
Pt Dc to home. Dc instructions reviewed with all questions answered. Understanding verbalized. Pt ambulatory out of dept with steady gait ?

## 2021-08-01 NOTE — ED Provider Notes (Signed)
? ?[facility] ?Provider Note ? ? ? Event Date/Time  ? First MD Initiated Contact with Patient 08/01/21 0222   ?  (approximate) ? ? ?History  ? ?Laceration ? ? ?HPI ? ?Suzanne Guzman is a 31 y.o. female right-hand-dominant who presents to the emergency department with a left thumb laceration.  States she cut it when trying to drive a wine glass tonight.  No other injury.  States her tetanus vaccine is up-to-date. ? ? ? ?History provided by patient. ? ? ? ?Past Medical History:  ?Diagnosis Date  ? Anxiety   ? Cholestasis during pregnancy in third trimester   ? Hypertension   ? Obesity   ? Wolff-Parkinson-White syndrome   ? treated with ablation  ? ? ?Past Surgical History:  ?Procedure Laterality Date  ? CARDIAC ELECTROPHYSIOLOGY MAPPING AND ABLATION    ? heart  ? CESAREAN SECTION N/A 09/14/2019  ? Procedure: CESAREAN SECTION;  Surgeon: Will Bonnet, MD;  Location: [facility] ORS;  Service: Obstetrics;  Laterality: N/A;  ? ? ?MEDICATIONS:  ?Prior to Admission medications   ?Medication Sig Start Date End Date Taking? Authorizing Provider  ?enoxaparin (LOVENOX) 40 MG/0.4ML injection Inject 0.4 mLs (40 mg total) into the skin daily for 21 days. 09/20/19 10/11/19  Dalia Heading, CNM  ?escitalopram (LEXAPRO) 10 MG tablet Take 1 tablet (10 mg total) by mouth daily. 11/22/19   Will Bonnet, MD  ?hydrOXYzine (ATARAX/VISTARIL) 25 MG tablet Take 1 tablet (25 mg total) by mouth every 6 (six) hours as needed for itching or anxiety. 09/19/19   Dalia Heading, CNM  ?ibuprofen (ADVIL) 600 MG tablet Take 1 tablet (600 mg total) by mouth every 6 (six) hours as needed for fever or headache. 09/19/19   Dalia Heading, CNM  ?iron polysaccharides (NIFEREX) 150 MG capsule Take 1 capsule (150 mg total) by mouth daily. 09/19/19   Dalia Heading, CNM  ?labetalol (NORMODYNE) 200 MG tablet Take 1 tablet (200 mg total) by mouth daily. Take mid day between doses of 300 mg 03/01/20   Will Bonnet, MD   ?labetalol (NORMODYNE) 300 MG tablet Take 1 tablet (300 mg total) by mouth 2 (two) times daily. 03/01/20   Will Bonnet, MD  ?NIFEdipine (PROCARDIA XL) 60 MG 24 hr tablet Take 1 tablet (60 mg total) by mouth daily. 10/20/19   Will Bonnet, MD  ?norethindrone (MICRONOR) 0.35 MG tablet Take 1 tablet (0.35 mg total) by mouth daily. 10/12/19   Dalia Heading, CNM  ?omeprazole (PRILOSEC) 20 MG capsule Take 1 capsule (20 mg total) by mouth daily. 09/19/19   Dalia Heading, CNM  ?oxyCODONE-acetaminophen (PERCOCET) 10-325 MG tablet Take 0.5 to 1 tablet every 6 hours prn 09/19/19   Dalia Heading, CNM  ?Prenatal Vit-Fe Fumarate-FA (MULTIVITAMIN-PRENATAL) 27-0.8 MG TABS tablet Take 1 tablet by mouth daily at 12 noon.    [provider]  ? ? ?Physical Exam  ? ?Triage Vital Signs: ?ED Triage Vitals  ?Enc Vitals Group  ?   BP 08/01/21 0141 (!) 148/97  ?   Pulse Rate 08/01/21 0141 67  ?   Resp 08/01/21 0141 16  ?   Temp 08/01/21 0141 98.1 ?F (36.7 ?C)  ?   Temp Source 08/01/21 0141 Oral  ?   SpO2 08/01/21 0141 99 %  ?   Weight 08/01/21 0143 200 lb (90.7 kg)  ?   Height 08/01/21 0143 '5\' 3"'$  (1.6 m)  ?   Head Circumference --   ?  Peak Flow --   ?   Pain Score 08/01/21 0142 3  ?   Pain Loc --   ?   Pain Edu? --   ?   Excl. in [place]? --   ? ? ?Most recent vital signs: ?Vitals:  ? 08/01/21 0141 08/01/21 0310  ?BP: (!) 148/97 (!) 131/54  ?Pulse: 67 82  ?Resp: 16 15  ?Temp: 98.1 ?F (36.7 ?C) 97.9 ?F (36.6 ?C)  ?SpO2: 99% 99%  ? ? ? ?CONSTITUTIONAL: Alert and responds appropriately to questions. Well-appearing; well-nourished ?HEAD: Normocephalic, atraumatic ?EYES: Conjunctivae clear, pupils appear equal ?ENT: normal nose; moist mucous membranes ?NECK: Normal range of motion ?CARD: Regular rate and rhythm ?RESP: Normal chest excursion without splinting or tachypnea; no hypoxia or respiratory distress, speaking full sentences ?ABD/GI: non-distended ?EXT: Normal ROM in all joints, no major deformities noted; 4 cm  superficial laceration to the palmar aspect of the left thumb with no retained foreign body and no tendon involvement.  She has full range of motion in the thumb, normal capillary refill.  Normal sensation.  2+ left radial pulse. ?SKIN: Normal color for age and race, no rashes on exposed skin ?NEURO: Moves all extremities equally, normal speech, no facial asymmetry noted ?PSYCH: The patient's mood and manner are appropriate. Grooming and personal hygiene are appropriate. ? ?ED Results / Procedures / Treatments  ? ?LABS: ?(all labs ordered are listed, but only abnormal results are displayed) ?Labs Reviewed - No data to display ? ? ?EKG: ? EKG Interpretation ? ?Date/Time:    ?Ventricular Rate:    ?PR Interval:    ?QRS Duration:   ?QT Interval:    ?QTC Calculation:   ?R Axis:     ?Text Interpretation:   ?  ? ?  ? ? ? ? ?RADIOLOGY: ?My personal review and interpretation of imaging: X-ray shows no fracture or retained foreign body. ? ?I have personally reviewed all radiology reports. ?DG Finger Thumb Left ? ?Result Date: 08/01/2021 ?CLINICAL DATA:  Laceration to the thumb, initial encounter EXAM: LEFT THUMB 2+V COMPARISON:  None Available. FINDINGS: Soft tissue irregularity is noted consistent with the recent laceration. No acute fracture is seen. No radiopaque foreign body is noted. IMPRESSION: Soft tissue injury without acute bony abnormality. Electronically Signed   By: Inez Catalina M.D.   On: 08/01/2021 02:44   ? ? ?PROCEDURES: ? ?Critical Care performed: No ? ?LACERATION REPAIR ?Performed by: Cyril Mourning Chanika Byland ?Authorized by: Cyril Mourning Jujhar Everett ?Consent: Verbal consent obtained. ?Risks and benefits: risks, benefits and alternatives were discussed ?Consent given by: patient ?Patient identity confirmed: provided demographic data ?Prepped and Draped in normal sterile fashion ?Wound explored ? ?Laceration Location: Left thumb ? ?Laceration Length: 4cm ? ?No Foreign Bodies seen or palpated ? ?Anesthesia: local infiltration ? ?Local  anesthetic: lidocaine 1% without epinephrine ? ?Anesthetic total: 5 ml ? ?Irrigation method: syringe ?Amount of cleaning: standard ? ?Skin closure: Superficial ? ?Number of sutures: 8 superficial simple interrupted with 5-0 Vicryl ? ?Technique: Area anesthetized using lidocaine 1% without epinephrine. Wound irrigated copiously with sterile saline. Wound then cleaned with Betadine and draped in sterile fashion. Wound closed using 8 simple interrupted sutures with 5-0 Vicryl.  Bacitracin and sterile dressing applied. Good wound approximation and hemostasis achieved.  ? ? ?Patient tolerance: Patient tolerated the procedure well with no immediate complications. ? ? ? ?Procedures ? ? ? ?IMPRESSION / MDM / ASSESSMENT AND PLAN / ED COURSE  ?I reviewed the triage vital signs and the nursing notes. ? ? ?  Patient here with superficial laceration to the left thumb.  No tendon involvement or retained foreign body. ? ? ? ? ?DIFFERENTIAL DIAGNOSIS (includes but not limited to):   Laceration, no sign of tendon laceration, nerve injury, doubt fracture or retained foreign body ? ? ?PLAN: X-ray obtained from triage.  X-ray reviewed and interpreted by myself and radiology and shows no retained foreign body or fracture.  We will clean wound and repair.  Tetanus vaccine up-to-date. ? ? ?MEDICATIONS GIVEN IN ED: ?Medications - No data to display ? ? ?ED COURSE: Discussed wound care instructions and return precautions.  Patient verbalized understanding.  Will discharge home.  Provided with work note for the morning. ? ? ?At this time, I do not feel there is any life-threatening condition present. I reviewed all nursing notes, vitals, pertinent previous records.  All lab and urine results, EKGs, imaging ordered have been independently reviewed and interpreted by myself.  I reviewed all available radiology reports from any imaging ordered this visit.  Based on my assessment, I feel the patient is safe to be discharged home without further  emergent workup and can continue workup as an outpatient as needed. Discussed all findings, treatment plan as well as usual and customary return precautions with patient.  They verbalize understanding a

## 2021-08-01 NOTE — ED Triage Notes (Signed)
Pt has laceration to left thumb from a broke wine glass, bleeding controlled at current, warp in guaze. Last Tetanus 1-2 year PTA.  Pt A&O x4, NAD noted. ?

## 2021-08-01 NOTE — ED Triage Notes (Signed)
First nurse note. Pt from home via [facility]. Lac to left from washing wine glass. Bleeding controlled, but slightly oozing thru dressing.  ?

## 2021-09-01 ENCOUNTER — Ambulatory Visit
Admission: RE | Admit: 2021-09-01 | Discharge: 2021-09-01 | Disposition: A | Discharge status: Home / Self Care | Payer: BC Managed Care – PPO | Source: Ambulatory Visit | Attending: Internal Medicine | Admitting: Internal Medicine

## 2021-09-01 VITALS — BP 136/77 | HR 76 | Temp 98.9°F | Resp 17

## 2021-09-01 DIAGNOSIS — H6123 Impacted cerumen, bilateral: Secondary | ICD-10-CM | POA: Diagnosis not present

## 2021-09-01 NOTE — ED Triage Notes (Signed)
Pt reports bilateral ear fullness. States the right is worse than the left. Tried Debrox and it was not very effective.

## 2021-09-01 NOTE — ED Provider Notes (Signed)
[facility]    CSN: [id number] Arrival date & time: 09/01/21  1656      History   Chief Complaint Chief Complaint  Patient presents with   Ear Fullness    Entered by patient    HPI Suzanne Guzman is a 31 y.o. female who presents with bilateral ears feeling full R>L. Has hx of cerumen impaction and has been using debrox, but has not helped. Has to have lavages done a couple times a year    Past Medical History:  Diagnosis Date   Anxiety    Cholestasis during pregnancy in third trimester    Hypertension    Obesity    Wolff-Parkinson-White syndrome    treated with ablation    Patient Active Problem List   Diagnosis Date Noted   Essential hypertension 03/01/2020   Fetal intolerance to labor, delivered, current hospitalization 09/19/2019   Delivery by cesarean section using transverse incision of lower segment of uterus 09/19/2019   Anxiety disorder 09/19/2019   Encounter for care or examination of lactating mother 09/17/2019   Acute respiratory failure ([place])    Seizure ([place])    Preeclampsia, third trimester 09/12/2019   Pain of left calf 07/25/2019   Gastroesophageal reflux disease     Past Surgical History:  Procedure Laterality Date   CARDIAC ELECTROPHYSIOLOGY MAPPING AND ABLATION     heart   CESAREAN SECTION N/A 09/14/2019   Procedure: CESAREAN SECTION;  Surgeon: Will Bonnet, MD;  Location: [facility] ORS;  Service: Obstetrics;  Laterality: N/A;    OB History     Gravida  1   Para  1   Term      Preterm  1   AB  0   Living  1      SAB      IAB      Ectopic      Multiple  0   Live Births  1            Home Medications    Prior to Admission medications   Medication Sig Start Date End Date Taking? Authorizing Provider  enoxaparin (LOVENOX) 40 MG/0.4ML injection Inject 0.4 mLs (40 mg total) into the skin daily for 21 days. 09/20/19 10/11/19  Dalia Heading, CNM  escitalopram (LEXAPRO) 10 MG tablet Take 1  tablet (10 mg total) by mouth daily. 11/22/19   Will Bonnet, MD  hydrOXYzine (ATARAX/VISTARIL) 25 MG tablet Take 1 tablet (25 mg total) by mouth every 6 (six) hours as needed for itching or anxiety. 09/19/19   Dalia Heading, CNM  ibuprofen (ADVIL) 600 MG tablet Take 1 tablet (600 mg total) by mouth every 6 (six) hours as needed for fever or headache. 09/19/19   Dalia Heading, CNM  iron polysaccharides (NIFEREX) 150 MG capsule Take 1 capsule (150 mg total) by mouth daily. 09/19/19   Dalia Heading, CNM  labetalol (NORMODYNE) 200 MG tablet Take 1 tablet (200 mg total) by mouth daily. Take mid day between doses of 300 mg 03/01/20   Will Bonnet, MD  labetalol (NORMODYNE) 300 MG tablet Take 1 tablet (300 mg total) by mouth 2 (two) times daily. 03/01/20   Will Bonnet, MD  NIFEdipine (PROCARDIA XL) 60 MG 24 hr tablet Take 1 tablet (60 mg total) by mouth daily. 10/20/19   Will Bonnet, MD  norethindrone (MICRONOR) 0.35 MG tablet Take 1 tablet (0.35 mg total) by mouth daily. 10/12/19   Dalia Heading, CNM  omeprazole (PRILOSEC) 20 MG  capsule Take 1 capsule (20 mg total) by mouth daily. 09/19/19   Dalia Heading, CNM  oxyCODONE-acetaminophen (PERCOCET) 10-325 MG tablet Take 0.5 to 1 tablet every 6 hours prn 09/19/19   Dalia Heading, CNM  Prenatal Vit-Fe Fumarate-FA (MULTIVITAMIN-PRENATAL) 27-0.8 MG TABS tablet Take 1 tablet by mouth daily at 12 noon.    [provider]    Family History Family History  Problem Relation Age of Onset   Cancer Mother    Other Father        MDS    Social History Social History   Tobacco Use   Smoking status: Never   Smokeless tobacco: Never  Vaping Use   Vaping Use: Never used  Substance Use Topics   Alcohol use: Not Currently    Alcohol/week: 1.0 standard drink of alcohol    Types: 1 Glasses of wine per week    Comment: ocassionly   Drug use: Never     Allergies   Nutmeg oil (myristica oil), Other,  Peanut-containing drug products, and Penicillins   Review of Systems Review of Systems  HENT:  Positive for hearing loss. Negative for ear pain.   Skin:  Negative for rash.     Physical Exam Triage Vital Signs ED Triage Vitals [09/01/21 1730]  Enc Vitals Group     BP 136/77     Pulse Rate 76     Resp 17     Temp 98.9 F (37.2 C)     Temp Source Oral     SpO2 97 %     Weight      Height      Head Circumference      Peak Flow      Pain Score 0     Pain Loc      Pain Edu?      Excl. in [place]?    No data found.  Updated Vital Signs BP 136/77 (BP Location: Right Arm)   Pulse 76   Temp 98.9 F (37.2 C) (Oral)   Resp 17   SpO2 97%   Visual Acuity Right Eye Distance:   Left Eye Distance:   Bilateral Distance:    Right Eye Near:   Left Eye Near:    Bilateral Near:     Physical Exam Vitals and nursing note reviewed.  Constitutional:      General: She is not in acute distress.    Appearance: She is obese.  HENT:     Head:     Comments: After ear lavage both TM's are normal    Right Ear: There is impacted cerumen.     Left Ear: Tympanic membrane and ear canal normal.  Eyes:     General: No scleral icterus.    Conjunctiva/sclera: Conjunctivae normal.  Pulmonary:     Effort: Pulmonary effort is normal.  Musculoskeletal:        General: Normal range of motion.     Cervical back: Neck supple.  Skin:    General: Skin is warm and dry.  Neurological:     Mental Status: She is alert and oriented to person, place, and time.     Gait: Gait normal.  Psychiatric:        Mood and Affect: Mood normal.        Behavior: Behavior normal.        Thought Content: Thought content normal.        Judgment: Judgment normal.      UC Treatments / Results  Labs (  all labs ordered are listed, but only abnormal results are displayed) Labs Reviewed - No data to display  EKG   Radiology No results found.  Procedures Procedures (including critical care  time)  Medications Ordered in UC Medications - No data to display  Initial Impression / Assessment and Plan / UC Course  I have reviewed the triage vital signs and the nursing notes.  Bilateral cerumen impaction  Fu prn   Final Clinical Impressions(s) / UC Diagnoses   Final diagnoses:  Bilateral hearing loss due to cerumen impaction   Discharge Instructions   None    ED Prescriptions   None    PDMP not reviewed this encounter.   Shelby Mattocks, [place] 09/01/21 1803

## 2021-10-04 DIAGNOSIS — M545 Low back pain, unspecified: Secondary | ICD-10-CM | POA: Diagnosis not present

## 2021-10-04 DIAGNOSIS — Z20822 Contact with and (suspected) exposure to covid-19: Secondary | ICD-10-CM | POA: Diagnosis not present

## 2021-10-04 DIAGNOSIS — R109 Unspecified abdominal pain: Secondary | ICD-10-CM | POA: Diagnosis not present

## 2021-10-04 DIAGNOSIS — R0602 Shortness of breath: Secondary | ICD-10-CM | POA: Diagnosis not present

## 2021-10-04 DIAGNOSIS — R1011 Right upper quadrant pain: Secondary | ICD-10-CM | POA: Diagnosis not present

## 2021-10-04 DIAGNOSIS — R059 Cough, unspecified: Secondary | ICD-10-CM | POA: Diagnosis not present

## 2021-10-05 DIAGNOSIS — R109 Unspecified abdominal pain: Secondary | ICD-10-CM | POA: Diagnosis not present

## 2021-10-16 ENCOUNTER — Encounter: Payer: Self-pay | Admitting: Emergency Medicine

## 2021-10-16 ENCOUNTER — Ambulatory Visit
Admission: EM | Admit: 2021-10-16 | Discharge: 2021-10-16 | Disposition: A | Discharge status: Home / Self Care | Payer: BC Managed Care – PPO | Attending: Emergency Medicine | Admitting: Emergency Medicine

## 2021-10-16 DIAGNOSIS — J0140 Acute pansinusitis, unspecified: Secondary | ICD-10-CM | POA: Diagnosis not present

## 2021-10-16 MED ORDER — IPRATROPIUM BROMIDE 0.06 % NA SOLN
2.0000 | Freq: Four times a day (QID) | NASAL | 12 refills | Status: DC
Start: 2021-10-16 — End: 2022-01-12

## 2021-10-16 MED ORDER — BENZONATATE 100 MG PO CAPS: 200.0000 mg | ORAL_CAPSULE | Freq: Three times a day (TID) | ORAL | 0 refills | Status: DC

## 2021-10-16 MED ORDER — PROMETHAZINE-DM 6.25-15 MG/5ML PO SYRP: 5.0000 mL | ORAL_SOLUTION | Freq: Four times a day (QID) | ORAL | 0 refills | Status: DC | PRN

## 2021-10-16 MED ORDER — DOXYCYCLINE HYCLATE 100 MG PO CAPS
100.0000 mg | ORAL_CAPSULE | Freq: Two times a day (BID) | ORAL | 0 refills | Status: DC
Start: 2021-10-16 — End: 2022-05-08

## 2021-10-16 NOTE — ED Triage Notes (Signed)
Patient c/o sinus congestion and pressure for a week.  Patient denies fevers.

## 2021-10-16 NOTE — ED Provider Notes (Signed)
[facility]    CSN: [id number] Arrival date & time: 10/16/21  0801      History   Chief Complaint Chief Complaint  Patient presents with   Sinus Problem    HPI Suzanne Guzman is a 31 y.o. female.   HPI  31 year old female here for evaluation of sinus complaints.  Patient reports that for last 2 weeks she has been experiencing sinus pressure and nasal congestion.  She does occasionally get some yellow discharge from her nose.  She is endorsing pain in her right ear and also pain on the right side of her throat.  She does have a cough that is nonproductive.  She also endorses sensitivity in her upper teeth.  She denies fever, shortness of breath, or wheezing.  Patient has a significant past medical history to include acute respiratory failure, seizures, essential hypertension, GERD, and WPW.  She did have an ablation for the WPW and has not had any issues with palpitations or arrhythmias.  Past Medical History:  Diagnosis Date   Anxiety    Cholestasis during pregnancy in third trimester    Hypertension    Obesity    Wolff-Parkinson-White syndrome    treated with ablation    Patient Active Problem List   Diagnosis Date Noted   Essential hypertension 03/01/2020   Fetal intolerance to labor, delivered, current hospitalization 09/19/2019   Delivery by cesarean section using transverse incision of lower segment of uterus 09/19/2019   Anxiety disorder 09/19/2019   Encounter for care or examination of lactating mother 09/17/2019   Acute respiratory failure ([place])    Seizure ([place])    Preeclampsia, third trimester 09/12/2019   Pain of left calf 07/25/2019   Gastroesophageal reflux disease     Past Surgical History:  Procedure Laterality Date   CARDIAC ELECTROPHYSIOLOGY MAPPING AND ABLATION     heart   CESAREAN SECTION N/A 09/14/2019   Procedure: CESAREAN SECTION;  Surgeon: Will Bonnet, MD;  Location: [facility] ORS;  Service: Obstetrics;  Laterality:  N/A;    OB History     Gravida  1   Para  1   Term      Preterm  1   AB  0   Living  1      SAB      IAB      Ectopic      Multiple  0   Live Births  1            Home Medications    Prior to Admission medications   Medication Sig Start Date End Date Taking? Authorizing Provider  benzonatate (TESSALON) 100 MG capsule Take 2 capsules (200 mg total) by mouth every 8 (eight) hours. 10/16/21  Yes Margarette Canada, NP  doxycycline (VIBRAMYCIN) 100 MG capsule Take 1 capsule (100 mg total) by mouth 2 (two) times daily. 10/16/21  Yes Margarette Canada, NP  escitalopram (LEXAPRO) 10 MG tablet Take 1 tablet (10 mg total) by mouth daily. 11/22/19  Yes Will Bonnet, MD  ipratropium (ATROVENT) 0.06 % nasal spray Place 2 sprays into both nostrils 4 (four) times daily. 10/16/21  Yes Margarette Canada, NP  labetalol (NORMODYNE) 200 MG tablet Take 1 tablet (200 mg total) by mouth daily. Take mid day between doses of 300 mg 03/01/20  Yes Will Bonnet, MD  labetalol (NORMODYNE) 300 MG tablet Take 1 tablet (300 mg total) by mouth 2 (two) times daily. 03/01/20  Yes Will Bonnet, MD  NIFEdipine [facility]  XL) 60 MG 24 hr tablet Take 1 tablet (60 mg total) by mouth daily. 10/20/19  Yes Will Bonnet, MD  promethazine-dextromethorphan (PROMETHAZINE-DM) 6.25-15 MG/5ML syrup Take 5 mLs by mouth 4 (four) times daily as needed. 10/16/21  Yes Margarette Canada, NP  hydrOXYzine (ATARAX/VISTARIL) 25 MG tablet Take 1 tablet (25 mg total) by mouth every 6 (six) hours as needed for itching or anxiety. 09/19/19   Dalia Heading, CNM  ibuprofen (ADVIL) 600 MG tablet Take 1 tablet (600 mg total) by mouth every 6 (six) hours as needed for fever or headache. 09/19/19   Dalia Heading, CNM  iron polysaccharides (NIFEREX) 150 MG capsule Take 1 capsule (150 mg total) by mouth daily. 09/19/19   Dalia Heading, CNM  norethindrone (MICRONOR) 0.35 MG tablet Take 1 tablet (0.35 mg total) by mouth daily. 10/12/19    Dalia Heading, CNM  omeprazole (PRILOSEC) 20 MG capsule Take 1 capsule (20 mg total) by mouth daily. 09/19/19   Dalia Heading, CNM  oxyCODONE-acetaminophen (PERCOCET) 10-325 MG tablet Take 0.5 to 1 tablet every 6 hours prn 09/19/19   Dalia Heading, CNM  Prenatal Vit-Fe Fumarate-FA (MULTIVITAMIN-PRENATAL) 27-0.8 MG TABS tablet Take 1 tablet by mouth daily at 12 noon.    [provider]    Family History Family History  Problem Relation Age of Onset   Cancer Mother    Other Father        MDS    Social History Social History   Tobacco Use   Smoking status: Never   Smokeless tobacco: Never  Vaping Use   Vaping Use: Never used  Substance Use Topics   Alcohol use: Not Currently    Alcohol/week: 1.0 standard drink of alcohol    Types: 1 Glasses of wine per week    Comment: ocassionly   Drug use: Never     Allergies   Nutmeg oil (myristica oil), Other, Peanut-containing drug products, and Penicillins   Review of Systems Review of Systems  Constitutional:  Negative for fever.  HENT:  Positive for congestion, ear pain, sinus pressure, sinus pain and sore throat. Negative for ear discharge.   Respiratory:  Positive for cough. Negative for shortness of breath and wheezing.   Cardiovascular:  Negative for chest pain and palpitations.     Physical Exam Triage Vital Signs ED Triage Vitals [10/16/21 0807]  Enc Vitals Group     BP      Pulse      Resp      Temp      Temp src      SpO2      Weight      Height      Head Circumference      Peak Flow      Pain Score 5     Pain Loc      Pain Edu?      Excl. in [place]?    No data found.  Updated Vital Signs BP 111/69 (BP Location: Left Arm)   Pulse 77   Temp 98.4 F (36.9 C) (Oral)   Resp 14   Ht '5\' 3"'  (1.6 m)   Wt 180 lb (81.6 kg)   LMP 09/25/2021   SpO2 99%   Breastfeeding No   BMI 31.89 kg/m   Visual Acuity Right Eye Distance:   Left Eye Distance:   Bilateral Distance:    Right Eye  Near:   Left Eye Near:    Bilateral Near:     Physical Exam Vitals  and nursing note reviewed.  Constitutional:      Appearance: Normal appearance. She is not ill-appearing.  HENT:     Head: Normocephalic and atraumatic.     Right Ear: Tympanic membrane, ear canal and external ear normal. There is no impacted cerumen.     Left Ear: Tympanic membrane, ear canal and external ear normal. There is no impacted cerumen.     Nose: Congestion and rhinorrhea present.     Mouth/Throat:     Mouth: Mucous membranes are moist.     Pharynx: Oropharynx is clear. Posterior oropharyngeal erythema present. No oropharyngeal exudate.  Cardiovascular:     Rate and Rhythm: Normal rate and regular rhythm.     Pulses: Normal pulses.     Heart sounds: Normal heart sounds. No murmur heard.    No friction rub. No gallop.  Pulmonary:     Effort: Pulmonary effort is normal.     Breath sounds: Normal breath sounds. No wheezing, rhonchi or rales.  Musculoskeletal:     Cervical back: Normal range of motion and neck supple.  Lymphadenopathy:     Cervical: No cervical adenopathy.  Skin:    General: Skin is warm and dry.     Capillary Refill: Capillary refill takes less than 2 seconds.     Findings: No erythema or rash.  Neurological:     General: No focal deficit present.     Mental Status: She is alert and oriented to person, place, and time.  Psychiatric:        Mood and Affect: Mood normal.        Behavior: Behavior normal.        Thought Content: Thought content normal.        Judgment: Judgment normal.      UC Treatments / Results  Labs (all labs ordered are listed, but only abnormal results are displayed) Labs Reviewed - No data to display  EKG   Radiology No results found.  Procedures Procedures (including critical care time)  Medications Ordered in UC Medications - No data to display  Initial Impression / Assessment and Plan / UC Course  I have reviewed the triage vital signs and  the nursing notes.  Pertinent labs & imaging results that were available during my care of the patient were reviewed by me and considered in my medical decision making (see chart for details).  Patient is a very pleasant, nontoxic-appearing 31 year old female here for evaluation of sinus pressure as outlined in the HPI above.  Her physical exam reveals mildly erythematous tympanic membranes.  No effusion or bulging of the TM appreciated.  Both external auditory canals are clear.  Nasal mucosa is erythematous and markedly edematous.  I am unable to visualize the turbinates.  There is thick dried purulent discharge in the left nare.  Patient does have tenderness to percussion of both frontal and maxillary sinuses.  The tenderness is greater on the right side than the left.  Oropharyngeal exam reveals mild posterior oropharyngeal erythema and yellow postnasal drip.  No cervical lymphadenopathy appreciable exam.  Cardiopulmonary exam reveals S1-S2 heart sounds with regular rate and rhythm and lung sounds that are clear to auscultation in all fields.  Patient's exam is consistent with pansinusitis.  She has an anaphylactic allergy to penicillin so I will prescribe doxycycline twice daily for 10 days.  I have also suggested she perform sinus irrigation to help alleviate her mucus burden.  I have prescribed Atrovent nasal spray to help with the nasal  congestion and postnasal drip along with Tessalon Perles and Promethazine DM to help with cough and congestion.  Work note provided.  Return precautions reviewed.   Final Clinical Impressions(s) / UC Diagnoses   Final diagnoses:  Acute non-recurrent pansinusitis     Discharge Instructions      The Doxucycline twice daily with food for 10 days for treatment of your sinusitis.  Perform sinus irrigation 2-3 times a day with a [employer] sinus rinse kit and distilled water.  Do not use tap water.  You can use plain over-the-counter Mucinex every 6 hours to break  up the stickiness of the mucus so your body can clear it.  Increase your oral fluid intake to thin out your mucus so that is also able for your body to clear more easily.  Take an over-the-counter probiotic, such as Culturelle-align-activia, 1 hour after each dose of antibiotic to prevent diarrhea.  If you develop any new or worsening symptoms return for reevaluation or see your primary care provider.      ED Prescriptions     Medication Sig Dispense Auth. Provider   doxycycline (VIBRAMYCIN) 100 MG capsule Take 1 capsule (100 mg total) by mouth 2 (two) times daily. 20 capsule Margarette Canada, NP   ipratropium (ATROVENT) 0.06 % nasal spray Place 2 sprays into both nostrils 4 (four) times daily. 15 mL Margarette Canada, NP   benzonatate (TESSALON) 100 MG capsule Take 2 capsules (200 mg total) by mouth every 8 (eight) hours. 21 capsule Margarette Canada, NP   promethazine-dextromethorphan (PROMETHAZINE-DM) 6.25-15 MG/5ML syrup Take 5 mLs by mouth 4 (four) times daily as needed. 118 mL Margarette Canada, NP      PDMP not reviewed this encounter.   Margarette Canada, NP 10/16/21 [phone]

## 2021-10-16 NOTE — Discharge Instructions (Addendum)
The Doxucycline twice daily with food for 10 days for treatment of your sinusitis.  Perform sinus irrigation 2-3 times a day with a [employer] sinus rinse kit and distilled water.  Do not use tap water.  You can use plain over-the-counter Mucinex every 6 hours to break up the stickiness of the mucus so your body can clear it.  Increase your oral fluid intake to thin out your mucus so that is also able for your body to clear more easily.  Take an over-the-counter probiotic, such as Culturelle-align-activia, 1 hour after each dose of antibiotic to prevent diarrhea.  If you develop any new or worsening symptoms return for reevaluation or see your primary care provider.

## 2021-10-20 DIAGNOSIS — I10 Essential (primary) hypertension: Secondary | ICD-10-CM | POA: Diagnosis not present

## 2021-10-20 DIAGNOSIS — F410 Panic disorder [episodic paroxysmal anxiety] without agoraphobia: Secondary | ICD-10-CM | POA: Diagnosis not present

## 2021-10-20 DIAGNOSIS — F411 Generalized anxiety disorder: Secondary | ICD-10-CM | POA: Diagnosis not present

## 2021-10-20 DIAGNOSIS — F429 Obsessive-compulsive disorder, unspecified: Secondary | ICD-10-CM | POA: Diagnosis not present

## 2021-11-13 DIAGNOSIS — Z20822 Contact with and (suspected) exposure to covid-19: Secondary | ICD-10-CM | POA: Diagnosis not present

## 2021-11-13 DIAGNOSIS — L819 Disorder of pigmentation, unspecified: Secondary | ICD-10-CM | POA: Diagnosis not present

## 2021-11-18 DIAGNOSIS — R0982 Postnasal drip: Secondary | ICD-10-CM | POA: Diagnosis not present

## 2021-11-18 DIAGNOSIS — E669 Obesity, unspecified: Secondary | ICD-10-CM | POA: Diagnosis not present

## 2021-11-18 DIAGNOSIS — R0981 Nasal congestion: Secondary | ICD-10-CM | POA: Diagnosis not present

## 2021-11-18 DIAGNOSIS — F429 Obsessive-compulsive disorder, unspecified: Secondary | ICD-10-CM | POA: Diagnosis not present

## 2021-11-18 DIAGNOSIS — I10 Essential (primary) hypertension: Secondary | ICD-10-CM | POA: Diagnosis not present

## 2021-11-18 DIAGNOSIS — M6208 Separation of muscle (nontraumatic), other site: Secondary | ICD-10-CM | POA: Diagnosis not present

## 2021-11-18 DIAGNOSIS — Z8759 Personal history of other complications of pregnancy, childbirth and the puerperium: Secondary | ICD-10-CM | POA: Diagnosis not present

## 2021-11-18 DIAGNOSIS — F411 Generalized anxiety disorder: Secondary | ICD-10-CM | POA: Diagnosis not present

## 2021-12-06 ENCOUNTER — Ambulatory Visit
Admission: EM | Admit: 2021-12-06 | Discharge: 2021-12-06 | Disposition: A | Discharge status: Home / Self Care | Payer: BC Managed Care – PPO | Attending: Internal Medicine | Admitting: Internal Medicine

## 2021-12-06 ENCOUNTER — Encounter: Payer: Self-pay | Admitting: Emergency Medicine

## 2021-12-06 DIAGNOSIS — N898 Other specified noninflammatory disorders of vagina: Secondary | ICD-10-CM | POA: Insufficient documentation

## 2021-12-06 DIAGNOSIS — R0981 Nasal congestion: Secondary | ICD-10-CM | POA: Insufficient documentation

## 2021-12-06 DIAGNOSIS — I10 Essential (primary) hypertension: Secondary | ICD-10-CM | POA: Diagnosis not present

## 2021-12-06 LAB — WET PREP, GENITAL
Sperm: NONE SEEN
Trich, Wet Prep: NONE SEEN
WBC, Wet Prep HPF POC: <10 — AB (ref <10)

## 2021-12-06 LAB — URINALYSIS, MICROSCOPIC (REFLEX)

## 2021-12-06 LAB — URINALYSIS, ROUTINE W REFLEX MICROSCOPIC
Bilirubin Urine: NEGATIVE
Glucose, UA: NEGATIVE mg/dL
Hgb urine dipstick: NEGATIVE
Ketones, ur: NEGATIVE mg/dL
Nitrite: NEGATIVE
Protein, ur: NEGATIVE mg/dL
Specific Gravity, Urine: <1.005 — ABNORMAL LOW (ref 1.005–1.030)
pH: 5.5 (ref 5.0–8.0)

## 2021-12-06 MED ORDER — FLUCONAZOLE 150 MG PO TABS: 150.0000 mg | ORAL_TABLET | Freq: Once | ORAL | 0 refills | Status: AC

## 2021-12-06 MED ORDER — METRONIDAZOLE 500 MG PO TABS: 500.0000 mg | ORAL_TABLET | Freq: Two times a day (BID) | ORAL | 0 refills | Status: DC

## 2021-12-06 MED ORDER — TRIAMCINOLONE ACETONIDE 55 MCG/ACT NA AERO: 2.0000 | INHALATION_SPRAY | Freq: Every day | NASAL | 0 refills | Status: AC

## 2021-12-06 NOTE — Discharge Instructions (Addendum)
Symptoms and exam today are reassuring--no danger signs seen.  Prescription for nasal steroid spray sent to the pharmacy, to help with congestion.   Initial blood pressure was mildly elevated; on recheck it was within the normal range, 125/78.   A urinalysis did not suggest urinary tract infection, but did demonstrate yeast and a vaginal swab was positive for yeast and for clue cells, a sign of bacterial vaginosis--both of which can contribute to vaginal itching.  Prescriptions for fluconazole (for yeast) and for metronidazole (for bacterial vaginosis) were sent to the pharmacy.  Anticipate gradual improvement in nasal congestion and vaginal itching over the next several days.

## 2021-12-06 NOTE — ED Triage Notes (Signed)
Pt presents with bilateral ear pain, sinus pressure, and nasal congestion for x3days.

## 2021-12-06 NOTE — ED Provider Notes (Signed)
[facility]    CSN: [id number] Arrival date & time: 12/06/21  1243      History   Chief Complaint Chief Complaint  Patient presents with   Otalgia   Sinus Pressure    HPI Kora Groom Shiffer is a 31 y.o. female.  She presents today with several symptoms, including bilateral ear pain, sinus congestion, sensation of postnasal drip, for about 3 days.  She relates the ear pain as 5/10.  Also having some vaginal itching.  No change in vaginal discharge, no unusual bleeding, no pelvic pain.  No dysuria or urinary frequency. Initial blood pressure was elevated today, which concerns her because she is taking blood pressure medicine. Finally she also has a question about whether she can overdose on vitamin a through dietary ingestion of carrots.   Otalgia   Past Medical History:  Diagnosis Date   Anxiety    Cholestasis during pregnancy in third trimester    Hypertension    Obesity    Wolff-Parkinson-White syndrome    treated with ablation    Patient Active Problem List   Diagnosis Date Noted   Essential hypertension 03/01/2020   Fetal intolerance to labor, delivered, current hospitalization 09/19/2019   Delivery by cesarean section using transverse incision of lower segment of uterus 09/19/2019   Anxiety disorder 09/19/2019   Encounter for care or examination of lactating mother 09/17/2019   Acute respiratory failure ([place])    Seizure ([place])    Preeclampsia, third trimester 09/12/2019   Pain of left calf 07/25/2019   Gastroesophageal reflux disease     Past Surgical History:  Procedure Laterality Date   CARDIAC ELECTROPHYSIOLOGY MAPPING AND ABLATION     heart   CESAREAN SECTION N/A 09/14/2019   Procedure: CESAREAN SECTION;  Surgeon: Will Bonnet, MD;  Location: [facility] ORS;  Service: Obstetrics;  Laterality: N/A;    OB History     Gravida  1   Para  1   Term      Preterm  1   AB  0   Living  1      SAB      IAB      Ectopic       Multiple  0   Live Births  1            Home Medications    Prior to Admission medications   Medication Sig Start Date End Date Taking? Authorizing Provider  escitalopram (LEXAPRO) 10 MG tablet Take 1 tablet (10 mg total) by mouth daily. 11/22/19  Yes Will Bonnet, MD  fluconazole (DIFLUCAN) 150 MG tablet Take 1 tablet (150 mg total) by mouth once for 1 dose. Repeat dose in 3d if needed.  For yeast infection. 12/06/21 12/06/21 Yes Wynona Luna, MD  labetalol (NORMODYNE) 200 MG tablet Take 1 tablet (200 mg total) by mouth daily. Take mid day between doses of 300 mg 03/01/20  Yes Will Bonnet, MD  metroNIDAZOLE (FLAGYL) 500 MG tablet Take 1 tablet (500 mg total) by mouth 2 (two) times daily. 12/06/21  Yes Wynona Luna, MD  NIFEdipine (PROCARDIA XL) 60 MG 24 hr tablet Take 1 tablet (60 mg total) by mouth daily. 10/20/19  Yes Will Bonnet, MD  norethindrone (MICRONOR) 0.35 MG tablet Take 1 tablet (0.35 mg total) by mouth daily. 10/12/19  Yes Dalia Heading, CNM  triamcinolone (NASACORT) 55 MCG/ACT AERO nasal inhaler Place 2 sprays into the nose daily. 12/06/21  Yes Wynona Luna, MD  benzonatate (TESSALON) 100 MG capsule Take 2 capsules (200 mg total) by mouth every 8 (eight) hours. 10/16/21   Margarette Canada, NP  doxycycline (VIBRAMYCIN) 100 MG capsule Take 1 capsule (100 mg total) by mouth 2 (two) times daily. 10/16/21   Margarette Canada, NP  hydrOXYzine (ATARAX/VISTARIL) 25 MG tablet Take 1 tablet (25 mg total) by mouth every 6 (six) hours as needed for itching or anxiety. 09/19/19   Dalia Heading, CNM  ibuprofen (ADVIL) 600 MG tablet Take 1 tablet (600 mg total) by mouth every 6 (six) hours as needed for fever or headache. 09/19/19   Dalia Heading, CNM  ipratropium (ATROVENT) 0.06 % nasal spray Place 2 sprays into both nostrils 4 (four) times daily. 10/16/21   Margarette Canada, NP  iron polysaccharides (NIFEREX) 150 MG capsule Take 1 capsule (150 mg total) by  mouth daily. 09/19/19   Dalia Heading, CNM  labetalol (NORMODYNE) 300 MG tablet Take 1 tablet (300 mg total) by mouth 2 (two) times daily. 03/01/20   Will Bonnet, MD  omeprazole (PRILOSEC) 20 MG capsule Take 1 capsule (20 mg total) by mouth daily. 09/19/19   Dalia Heading, CNM  oxyCODONE-acetaminophen (PERCOCET) 10-325 MG tablet Take 0.5 to 1 tablet every 6 hours prn 09/19/19   Dalia Heading, CNM  Prenatal Vit-Fe Fumarate-FA (MULTIVITAMIN-PRENATAL) 27-0.8 MG TABS tablet Take 1 tablet by mouth daily at 12 noon.    [provider]  promethazine-dextromethorphan (PROMETHAZINE-DM) 6.25-15 MG/5ML syrup Take 5 mLs by mouth 4 (four) times daily as needed. 10/16/21   Margarette Canada, NP    Family History Family History  Problem Relation Age of Onset   Cancer Mother    Other Father        MDS    Social History Social History   Tobacco Use   Smoking status: Never   Smokeless tobacco: Never  Vaping Use   Vaping Use: Never used  Substance Use Topics   Alcohol use: Not Currently    Alcohol/week: 1.0 standard drink of alcohol    Types: 1 Glasses of wine per week    Comment: ocassionly   Drug use: Never     Allergies   Nutmeg oil (myristica oil), Other, Peanut-containing drug products, and Penicillins   Review of Systems Review of Systems  HENT:  Positive for ear pain.      Physical Exam Triage Vital Signs ED Triage Vitals  Enc Vitals Group     BP 12/06/21 1308 (!) 153/83     Pulse Rate 12/06/21 1308 80     Resp 12/06/21 1308 16     Temp 12/06/21 1308 99 F (37.2 C)     Temp Source 12/06/21 1308 Oral     SpO2 12/06/21 1308 97 %     Weight --      Height --      Head Circumference --      Peak Flow --      Pain Score 12/06/21 1305 5     Pain Loc --      Pain Edu? --      Excl. in [place]? --    No data found.  Updated Vital Signs BP 125/78 (BP Location: Left Arm)   Pulse 80   Temp 99 F (37.2 C) (Oral)   Resp 16   LMP  (LMP Unknown)   SpO2 97%    Visual Acuity Right Eye Distance:   Left Eye Distance:   Bilateral Distance:    Right Eye Near:  Left Eye Near:    Bilateral Near:     Physical Exam HENT:     Head:     Comments: Marked nasal congestion bilat, essentially occluded Mild hyperemia of posterior pharynx without post nasal discharge appreciated at the time of exam B TMs mildly dull, no erythema Eyes:     Conjunctiva/sclera:     Right eye: Right conjunctiva is not injected. No exudate.    Left eye: Left conjunctiva is not injected. No exudate.    Comments: Conjugate gaze was observed  Cardiovascular:     Rate and Rhythm: Normal rate and regular rhythm.  Pulmonary:     Effort: Pulmonary effort is normal. No respiratory distress.     Breath sounds: No wheezing or rhonchi.  Abdominal:     General: There is no distension.  Musculoskeletal:        General: No swelling.     Cervical back: Neck supple.     Comments: Walked into the urgent care independently  Skin:    General: Skin is warm and dry.     Comments: No cyanosis, no rash I did not appreciate an orange tint to the skin.  We did look at the her palms together and patient feels that there may be a slight difference from previous in the skin tone.    Neurological:     Comments: Face symmetric, speech clear, coherent, logical      UC Treatments / Results  Labs (all labs ordered are listed, but only abnormal results are displayed) Labs Reviewed  WET PREP, GENITAL - Abnormal; Notable for the following components:      Result Value   Yeast Wet Prep HPF POC PRESENT (*)    Clue Cells Wet Prep HPF POC PRESENT (*)    WBC, Wet Prep HPF POC <10 (*)    All other components within normal limits  URINALYSIS, ROUTINE W REFLEX MICROSCOPIC - Abnormal; Notable for the following components:   Specific Gravity, Urine <1.005 (*)    Leukocytes,Ua TRACE (*)    All other components within normal limits  URINALYSIS, MICROSCOPIC (REFLEX) - Abnormal; Notable for the  following components:   Bacteria, UA FEW (*)    All other components within normal limits    EKG   Radiology No results found.  Procedures Procedures (including critical care time)  Medications Ordered in UC Medications - No data to display  Initial Impression / Assessment and Plan / UC Course  I have reviewed the triage vital signs and the nursing notes.  Pertinent labs & imaging results that were available during my care of the patient were reviewed by me and considered in my medical decision making (see chart for details).     *** Final Clinical Impressions(s) / UC Diagnoses   Final diagnoses:  Vaginal itching  Primary hypertension  Nasal sinus congestion     Discharge Instructions      Symptoms and exam today are reassuring--no danger signs seen.  Prescription for nasal steroid spray sent to the pharmacy, to help with congestion.   Initial blood pressure was mildly elevated; on recheck it was within the normal range, 125/78.   A urinalysis did not suggest urinary tract infection, but did demonstrate yeast and a vaginal swab was positive for yeast and for clue cells, a sign of bacterial vaginosis--both of which can contribute to vaginal itching.  Prescriptions for fluconazole (for yeast) and for metronidazole (for bacterial vaginosis) were sent to the pharmacy.  Anticipate gradual improvement in  nasal congestion and vaginal itching over the next several days.   ED Prescriptions     Medication Sig Dispense Auth. Provider   triamcinolone (NASACORT) 55 MCG/ACT AERO nasal inhaler Place 2 sprays into the nose daily. 1 each Wynona Luna, MD   metroNIDAZOLE (FLAGYL) 500 MG tablet Take 1 tablet (500 mg total) by mouth 2 (two) times daily. 14 tablet Wynona Luna, MD   fluconazole (DIFLUCAN) 150 MG tablet Take 1 tablet (150 mg total) by mouth once for 1 dose. Repeat dose in 3d if needed.  For yeast infection. 2 tablet Wynona Luna, MD      PDMP not  reviewed this encounter.

## 2022-01-12 ENCOUNTER — Ambulatory Visit
Admission: EM | Admit: 2022-01-12 | Discharge: 2022-01-12 | Disposition: A | Discharge status: Home / Self Care | Payer: BC Managed Care – PPO | Attending: Emergency Medicine | Admitting: Emergency Medicine

## 2022-01-12 ENCOUNTER — Encounter: Payer: Self-pay | Admitting: Emergency Medicine

## 2022-01-12 DIAGNOSIS — H6993 Unspecified Eustachian tube disorder, bilateral: Secondary | ICD-10-CM

## 2022-01-12 DIAGNOSIS — H66001 Acute suppurative otitis media without spontaneous rupture of ear drum, right ear: Secondary | ICD-10-CM

## 2022-01-12 MED ORDER — IPRATROPIUM BROMIDE 0.06 % NA SOLN: 2.0000 | Freq: Four times a day (QID) | NASAL | 12 refills | Status: DC

## 2022-01-12 MED ORDER — AZITHROMYCIN 250 MG PO TABS: 250.0000 mg | ORAL_TABLET | Freq: Every day | ORAL | 0 refills | Status: DC

## 2022-01-12 NOTE — Discharge Instructions (Signed)
Take the Azithromycin daily for 5 days with food for treatment of your ear infection.  Take an over-the-counter probiotic 1 hour after each dose of antibiotic to prevent diarrhea.  Use over-the-counter Tylenol and ibuprofen as needed for pain or fever.  Place a hot water bottle, or heating pad, underneath your pillowcase at night to help dilate up your ear and aid in pain relief as well as resolution of the infection.  Use the Atrovent nasal spray, 2 squirts in each nostril every 6 hours, to help with nasal congestion.  Take over-the-counter Zyrtec, Claritin, or Allegra once daily to help with allergic symptoms.  Continue to equalize your ears as shown to help clear mucus from eustachian tubes and maintain patency.   Return for reevaluation for any new or worsening symptoms.

## 2022-01-12 NOTE — ED Provider Notes (Signed)
[facility]    CSN: [id number] Arrival date & time: 01/12/22  1304      History   Chief Complaint Chief Complaint  Patient presents with   Otalgia    HPI Suzanne Guzman is a 31 y.o. female.   HPI  31 year old female here for evaluation of bilateral ear pain.  Patient reports that she has been experiencing pain in both of her ears for the last 5 days.  The pain is mostly been on the right and has recently spread to the left side.  She denies any drainage from the ear or fever.  She has had some nasal congestion but denies any runny nose.  She also complains of pain behind the angle of the jaw on her right that is worse when she swallows or when she chews.  She also endorses a mild intermittent cough.  Past Medical History:  Diagnosis Date   Anxiety    Cholestasis during pregnancy in third trimester    Hypertension    Obesity    Wolff-Parkinson-White syndrome    treated with ablation    Patient Active Problem List   Diagnosis Date Noted   Essential hypertension 03/01/2020   Fetal intolerance to labor, delivered, current hospitalization 09/19/2019   Delivery by cesarean section using transverse incision of lower segment of uterus 09/19/2019   Anxiety disorder 09/19/2019   Encounter for care or examination of lactating mother 09/17/2019   Acute respiratory failure ([place])    Seizure ([place])    Preeclampsia, third trimester 09/12/2019   Pain of left calf 07/25/2019   Gastroesophageal reflux disease     Past Surgical History:  Procedure Laterality Date   CARDIAC ELECTROPHYSIOLOGY MAPPING AND ABLATION     heart   CESAREAN SECTION N/A 09/14/2019   Procedure: CESAREAN SECTION;  Surgeon: Will Bonnet, MD;  Location: [facility] ORS;  Service: Obstetrics;  Laterality: N/A;    OB History     Gravida  1   Para  1   Term      Preterm  1   AB  0   Living  1      SAB      IAB      Ectopic      Multiple  0   Live Births  1             Home Medications    Prior to Admission medications   Medication Sig Start Date End Date Taking? Authorizing Provider  azithromycin (ZITHROMAX Z-PAK) 250 MG tablet Take 1 tablet (250 mg total) by mouth daily. Take 2 tablets on the first day and then 1 tablet daily thereafter for a total of 5 days of treatment. 01/12/22  Yes Margarette Canada, NP  ipratropium (ATROVENT) 0.06 % nasal spray Place 2 sprays into both nostrils 4 (four) times daily. 01/12/22  Yes Margarette Canada, NP  benzonatate (TESSALON) 100 MG capsule Take 2 capsules (200 mg total) by mouth every 8 (eight) hours. 10/16/21   Margarette Canada, NP  doxycycline (VIBRAMYCIN) 100 MG capsule Take 1 capsule (100 mg total) by mouth 2 (two) times daily. 10/16/21   Margarette Canada, NP  escitalopram (LEXAPRO) 10 MG tablet Take 1 tablet (10 mg total) by mouth daily. 11/22/19   Will Bonnet, MD  hydrOXYzine (ATARAX/VISTARIL) 25 MG tablet Take 1 tablet (25 mg total) by mouth every 6 (six) hours as needed for itching or anxiety. 09/19/19   Dalia Heading, CNM  ibuprofen (ADVIL) 600 MG tablet  Take 1 tablet (600 mg total) by mouth every 6 (six) hours as needed for fever or headache. 09/19/19   Dalia Heading, CNM  iron polysaccharides (NIFEREX) 150 MG capsule Take 1 capsule (150 mg total) by mouth daily. 09/19/19   Dalia Heading, CNM  labetalol (NORMODYNE) 200 MG tablet Take 1 tablet (200 mg total) by mouth daily. Take mid day between doses of 300 mg 03/01/20   Will Bonnet, MD  labetalol (NORMODYNE) 300 MG tablet Take 1 tablet (300 mg total) by mouth 2 (two) times daily. 03/01/20   Will Bonnet, MD  NIFEdipine (PROCARDIA XL) 60 MG 24 hr tablet Take 1 tablet (60 mg total) by mouth daily. 10/20/19   Will Bonnet, MD  norethindrone (MICRONOR) 0.35 MG tablet Take 1 tablet (0.35 mg total) by mouth daily. 10/12/19   Dalia Heading, CNM  omeprazole (PRILOSEC) 20 MG capsule Take 1 capsule (20 mg total) by mouth daily. 09/19/19   Dalia Heading, CNM  oxyCODONE-acetaminophen (PERCOCET) 10-325 MG tablet Take 0.5 to 1 tablet every 6 hours prn 09/19/19   Dalia Heading, CNM  Prenatal Vit-Fe Fumarate-FA (MULTIVITAMIN-PRENATAL) 27-0.8 MG TABS tablet Take 1 tablet by mouth daily at 12 noon.    [provider]  promethazine-dextromethorphan (PROMETHAZINE-DM) 6.25-15 MG/5ML syrup Take 5 mLs by mouth 4 (four) times daily as needed. 10/16/21   Margarette Canada, NP  triamcinolone (NASACORT) 55 MCG/ACT AERO nasal inhaler Place 2 sprays into the nose daily. 12/06/21   Wynona Luna, MD    Family History Family History  Problem Relation Age of Onset   Cancer Mother    Other Father        MDS    Social History Social History   Tobacco Use   Smoking status: Never   Smokeless tobacco: Never  Vaping Use   Vaping Use: Never used  Substance Use Topics   Alcohol use: Not Currently    Alcohol/week: 1.0 standard drink of alcohol    Types: 1 Glasses of wine per week    Comment: ocassionly   Drug use: Never     Allergies   Nutmeg oil (myristica oil), Other, Peanut-containing drug products, and Penicillins   Review of Systems Review of Systems  Constitutional:  Negative for fever.  HENT:  Positive for congestion, ear pain and sore throat. Negative for ear discharge, hearing loss and rhinorrhea.   Respiratory:  Positive for cough. Negative for shortness of breath and wheezing.   Hematological: Negative.   Psychiatric/Behavioral: Negative.       Physical Exam Triage Vital Signs ED Triage Vitals [01/12/22 1313]  Enc Vitals Group     BP      Pulse      Resp      Temp      Temp src      SpO2      Weight      Height      Head Circumference      Peak Flow      Pain Score 4     Pain Loc      Pain Edu?      Excl. in [place]?    No data found.  Updated Vital Signs BP 126/70 (BP Location: Left Arm)   Pulse 87   Temp 98.9 F (37.2 C) (Oral)   Resp 16   SpO2 97%   Visual Acuity Right Eye Distance:   Left  Eye Distance:   Bilateral Distance:    Right Eye Near:  Left Eye Near:    Bilateral Near:     Physical Exam Vitals and nursing note reviewed.  Constitutional:      Appearance: Normal appearance. She is not ill-appearing.  HENT:     Head: Normocephalic and atraumatic.     Right Ear: Ear canal and external ear normal. There is no impacted cerumen.     Left Ear: Tympanic membrane, ear canal and external ear normal. There is no impacted cerumen.     Ears:     Comments: Patient's right tympanic membrane is erythematous and mildly injected.  Tenderness to both eustachian tubes when palpated externally.  Right greater than left.    Nose: Congestion and rhinorrhea present.     Comments: His mucosa has mild erythema and edema with scant clear rhinorrhea in both nares.    Mouth/Throat:     Mouth: Mucous membranes are moist.     Pharynx: Oropharynx is clear. No oropharyngeal exudate or posterior oropharyngeal erythema.  Cardiovascular:     Rate and Rhythm: Normal rate and regular rhythm.     Pulses: Normal pulses.     Heart sounds: Normal heart sounds. No murmur heard.    No friction rub. No gallop.  Pulmonary:     Effort: Pulmonary effort is normal.     Breath sounds: Normal breath sounds. No wheezing, rhonchi or rales.  Musculoskeletal:     Cervical back: Normal range of motion and neck supple.  Lymphadenopathy:     Cervical: No cervical adenopathy.  Skin:    General: Skin is warm and dry.     Capillary Refill: Capillary refill takes less than 2 seconds.     Findings: No erythema or rash.     Comments: Patient has a hyperpigmented scaly patch of skin on her left wrist on the lateral aspect that is approximately the size of a silver dollar.  Neurological:     General: No focal deficit present.     Mental Status: She is alert and oriented to person, place, and time.  Psychiatric:        Mood and Affect: Mood normal.        Behavior: Behavior normal.        Thought Content: Thought  content normal.        Judgment: Judgment normal.      UC Treatments / Results  Labs (all labs ordered are listed, but only abnormal results are displayed) Labs Reviewed - No data to display  EKG   Radiology No results found.  Procedures Procedures (including critical care time)  Medications Ordered in UC Medications - No data to display  Initial Impression / Assessment and Plan / UC Course  I have reviewed the triage vital signs and the nursing notes.  Pertinent labs & imaging results that were available during my care of the patient were reviewed by me and considered in my medical decision making (see chart for details).   Patient is a pleasant, nontoxic-appearing 24-year-old female here for evaluation of bilateral ear pain in the setting of upper and lower respiratory symptoms that has been going on for the past 5 days.  No fever, drainage from the ears, dizziness, or changes in hearing.  She has had some congestion but no runny nose.  She states that she has a sore throat but is only on the right-hand side and its when she swallows or when she chews but otherwise it is fine.  She has had a mild cough that is both productive  and nonproductive.  She is complaining of a dry throat when she talks for long peers of time.  She is a Pharmacist, hospital.  On exam she does have an erythematous injected right tympanic membrane.  She also has tenderness with palpation of the eustachian tubes externally.  I suspect that she has eustachian tube dysfunction and this is what is led to the ear infection on the right as evidenced by the erythema and injection of the tympanic membrane.  I will treat her for her eustachian tube dysfunction with Atrovent nasal spray and I have instructed the patient on how to equalize her ear to try and clear the eustachian tube.  She has an allergy to penicillin and also has an intolerance to doxycycline.  I will treat her otitis media with azithromycin.  In regards to the scaly  patch of skin on her left wrist that she is requesting a referral to dermatology which I have made a referral to [facility].  It looks like eczema.   Final Clinical Impressions(s) / UC Diagnoses   Final diagnoses:  Non-recurrent acute suppurative otitis media of right ear without spontaneous rupture of tympanic membrane  Eustachian tube dysfunction, bilateral     Discharge Instructions      Take the Azithromycin daily for 5 days with food for treatment of your ear infection.  Take an over-the-counter probiotic 1 hour after each dose of antibiotic to prevent diarrhea.  Use over-the-counter Tylenol and ibuprofen as needed for pain or fever.  Place a hot water bottle, or heating pad, underneath your pillowcase at night to help dilate up your ear and aid in pain relief as well as resolution of the infection.  Use the Atrovent nasal spray, 2 squirts in each nostril every 6 hours, to help with nasal congestion.  Take over-the-counter Zyrtec, Claritin, or Allegra once daily to help with allergic symptoms.  Continue to equalize your ears as shown to help clear mucus from eustachian tubes and maintain patency.   Return for reevaluation for any new or worsening symptoms.      ED Prescriptions     Medication Sig Dispense Auth. Provider   azithromycin (ZITHROMAX Z-PAK) 250 MG tablet Take 1 tablet (250 mg total) by mouth daily. Take 2 tablets on the first day and then 1 tablet daily thereafter for a total of 5 days of treatment. 6 tablet Margarette Canada, NP   ipratropium (ATROVENT) 0.06 % nasal spray Place 2 sprays into both nostrils 4 (four) times daily. 15 mL Margarette Canada, NP      PDMP not reviewed this encounter.   Margarette Canada, NP 01/12/22 1339

## 2022-01-12 NOTE — ED Triage Notes (Signed)
Pt presents with bilateral ear pain x 5 days

## 2022-02-13 IMAGING — US US EXTREM LOW VENOUS*L*
1 series · 14 of 24 positions shown · non-contrast
Comparison: None.

CLINICAL DATA: Left calf pain

EXAM:
Left LOWER EXTREMITY VENOUS DOPPLER ULTRASOUND
TECHNIQUE: Gray-scale sonography with compression, as well as color and duplex
ultrasound, were performed to evaluate the deep venous system(s)
from the level of the common femoral vein through the popliteal and
proximal calf veins.

[Series 1: us venous img lower uni left (dvt) · portal-venous · 14 of 34 slices shown]
[im 1/34]
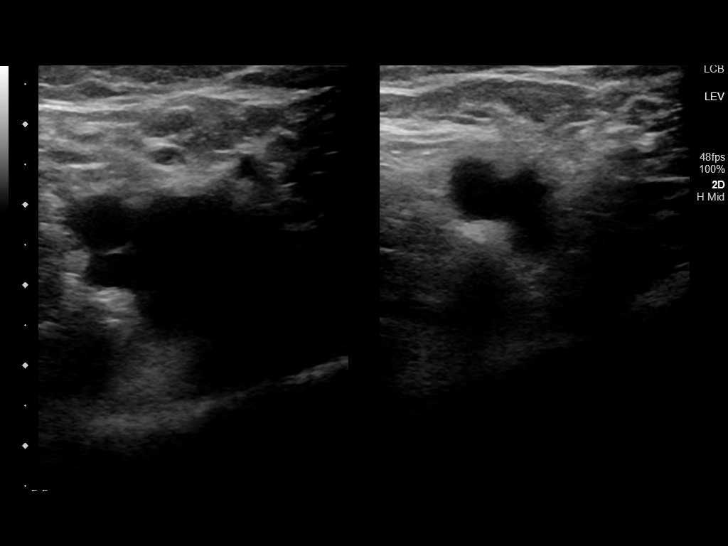
[im 3/34]
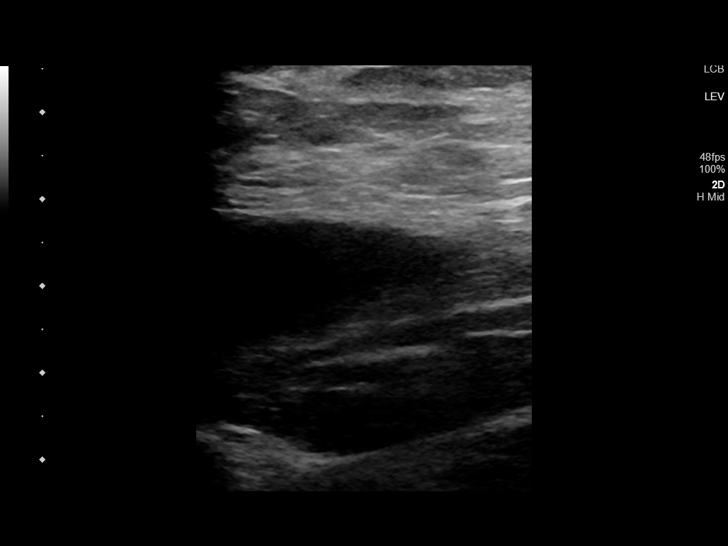
[im 6/34]
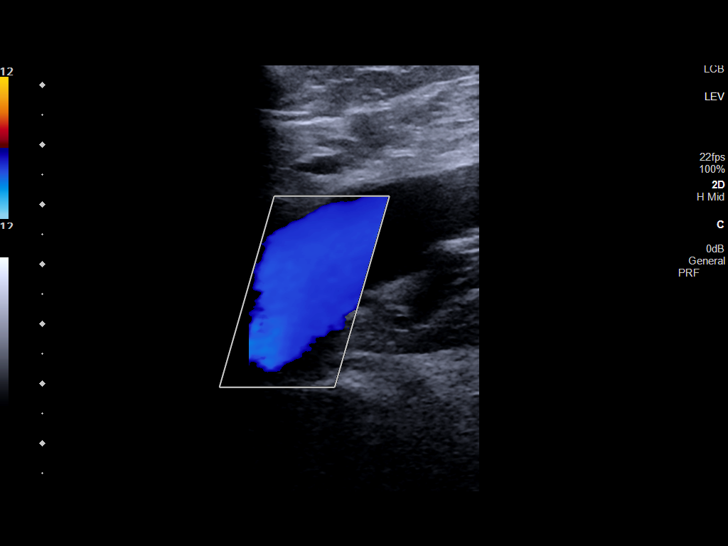
[im 9/34]
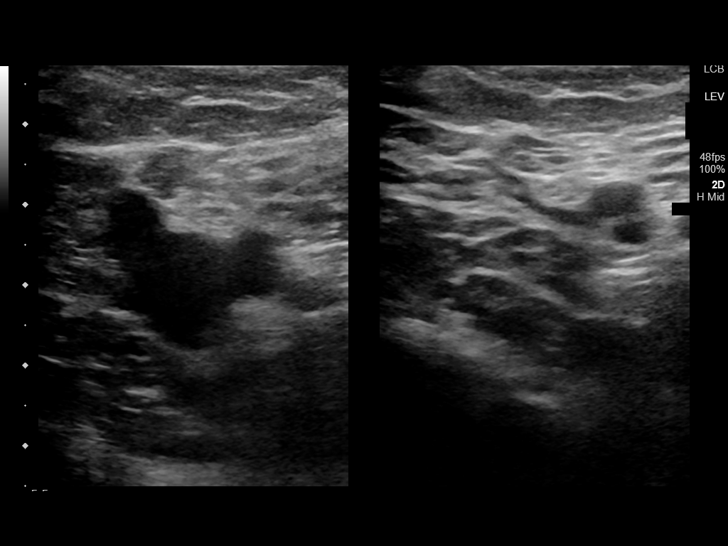
[im 11/34]
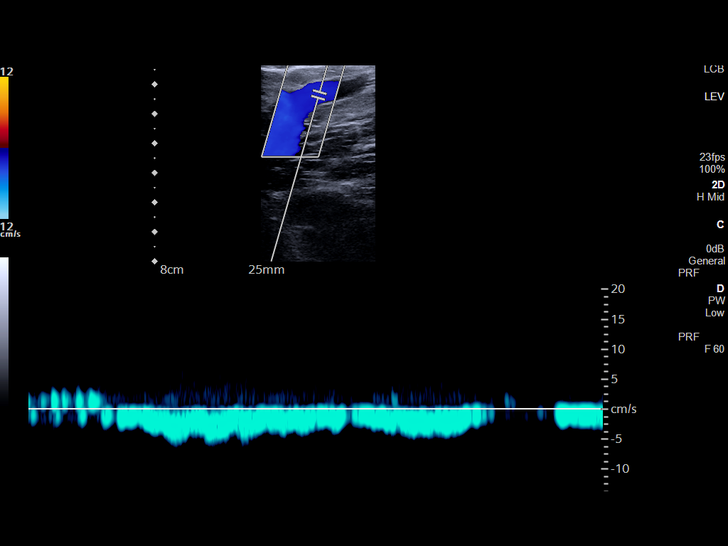
[im 13/34]
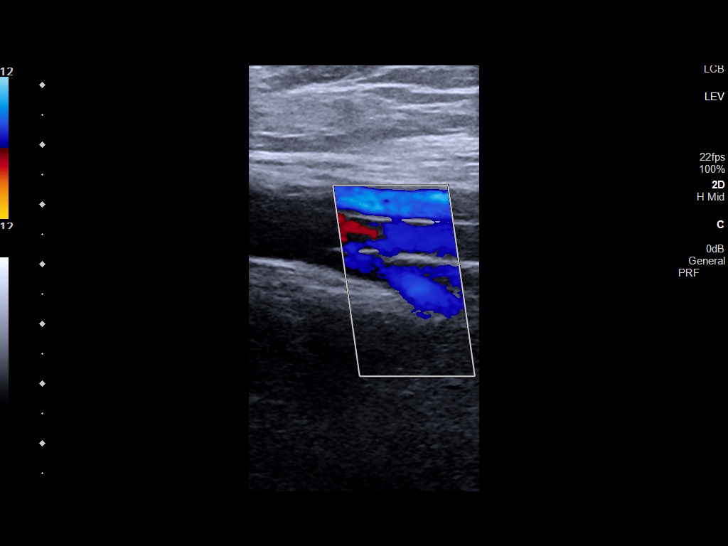
[im 16/34]
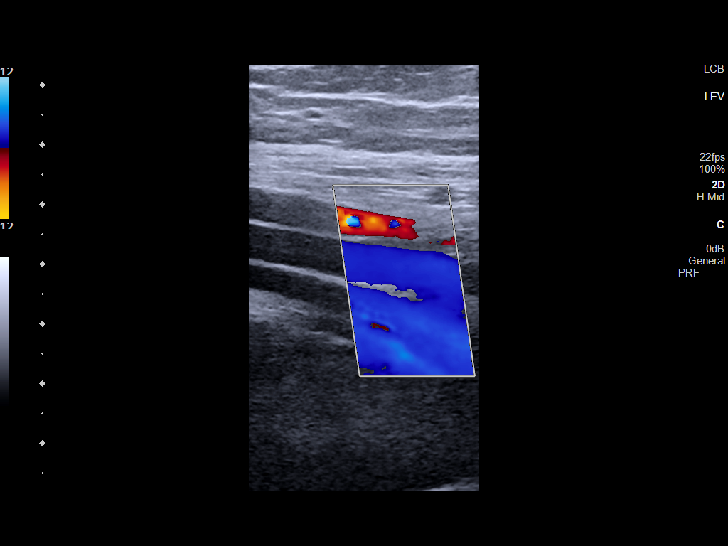
[im 18/34]
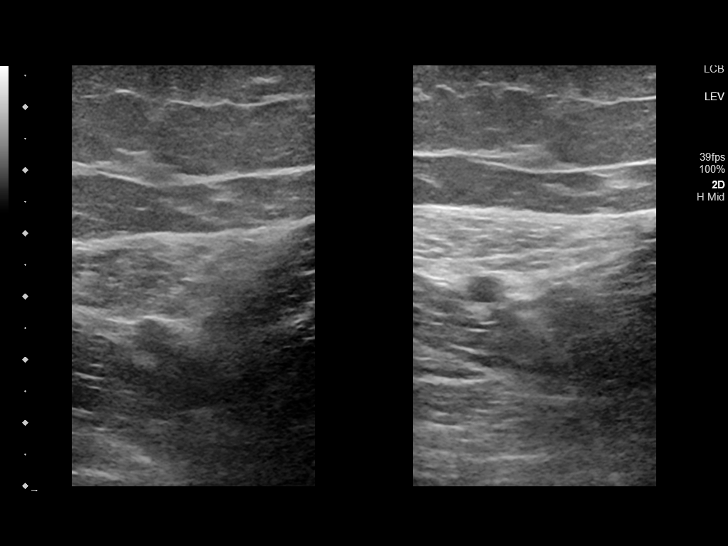
[im 21/34]
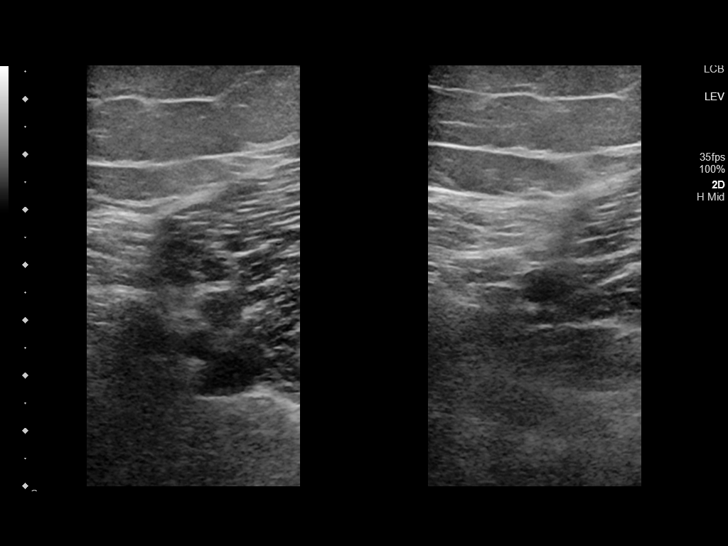
[im 23/34]
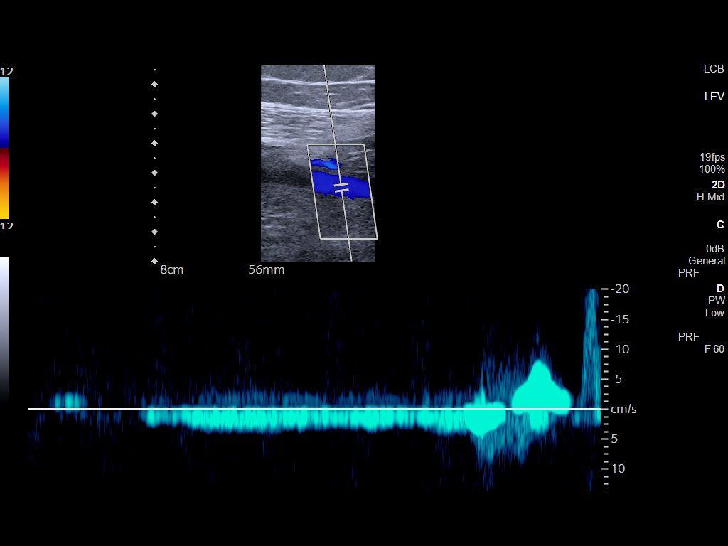
[im 26/34]
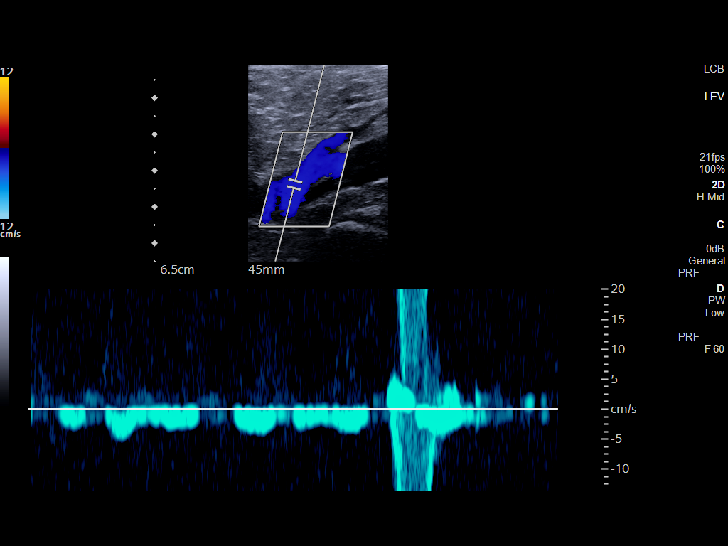
[im 28/34]
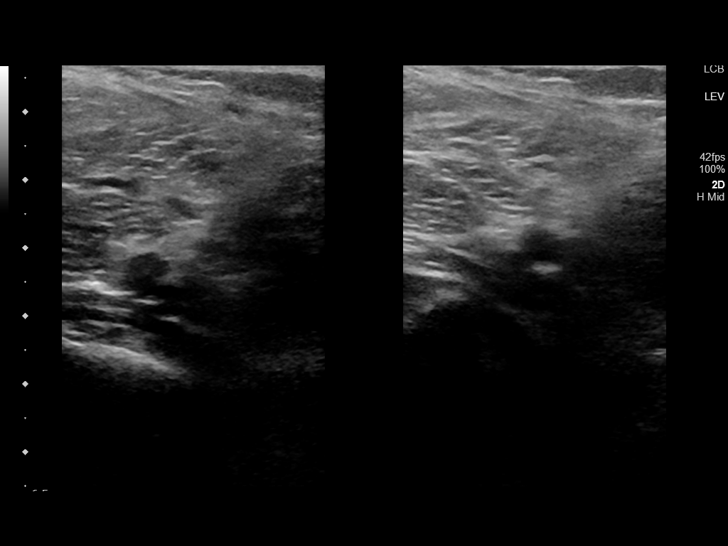
[im 31/34]
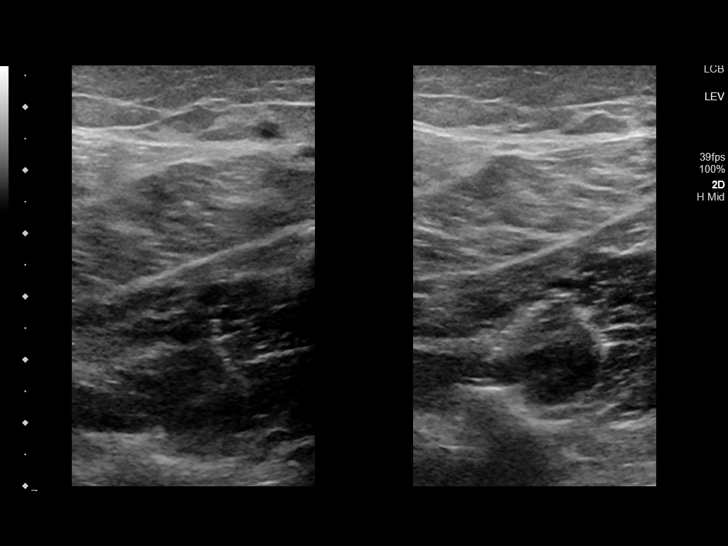
[im 34/34]
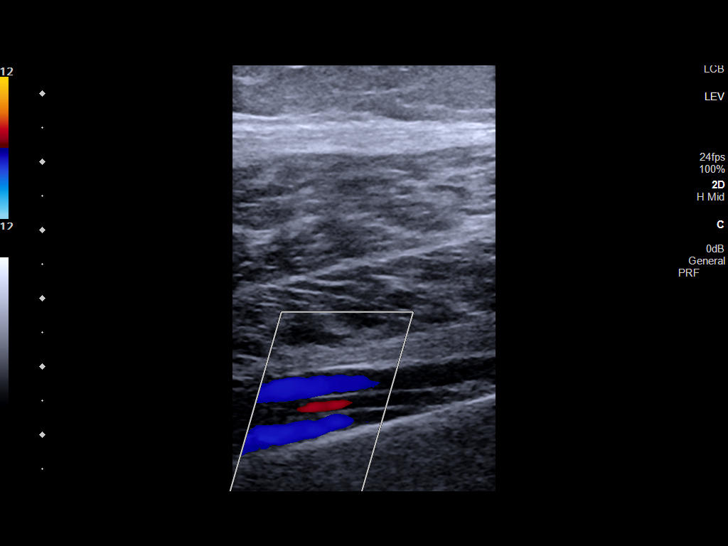

[14 of 24 positions shown; findings below may reference images not displayed]

FINDINGS: VENOUS

Normal compressibility of the common femoral, superficial femoral,
and popliteal veins, as well as the visualized calf veins.
Visualized portions of profunda femoral vein and great saphenous
vein unremarkable. No filling defects to suggest DVT on grayscale or
color Doppler imaging. Doppler waveforms show normal direction of
venous flow, normal respiratory plasticity and response to
augmentation.

Limited views of the contralateral common femoral vein are
unremarkable.

OTHER

None.

Limitations: none
IMPRESSION: Negative.

## 2022-03-17 DIAGNOSIS — R21 Rash and other nonspecific skin eruption: Secondary | ICD-10-CM | POA: Diagnosis not present

## 2022-03-17 DIAGNOSIS — Z1283 Encounter for screening for malignant neoplasm of skin: Secondary | ICD-10-CM | POA: Diagnosis not present

## 2022-03-17 DIAGNOSIS — E663 Overweight: Secondary | ICD-10-CM | POA: Diagnosis not present

## 2022-03-17 DIAGNOSIS — Z6829 Body mass index (BMI) 29.0-29.9, adult: Secondary | ICD-10-CM | POA: Diagnosis not present

## 2022-04-22 ENCOUNTER — Ambulatory Visit: Admit: 2022-04-22 | Discharge status: Absent without leave | Payer: BC Managed Care – PPO

## 2022-04-28 DIAGNOSIS — J309 Allergic rhinitis, unspecified: Secondary | ICD-10-CM | POA: Diagnosis not present

## 2022-04-28 DIAGNOSIS — L300 Nummular dermatitis: Secondary | ICD-10-CM | POA: Diagnosis not present

## 2022-04-28 DIAGNOSIS — E663 Overweight: Secondary | ICD-10-CM | POA: Diagnosis not present

## 2022-04-28 DIAGNOSIS — Z308 Encounter for other contraceptive management: Secondary | ICD-10-CM | POA: Diagnosis not present

## 2022-04-28 DIAGNOSIS — I10 Essential (primary) hypertension: Secondary | ICD-10-CM | POA: Diagnosis not present

## 2022-04-28 DIAGNOSIS — M549 Dorsalgia, unspecified: Secondary | ICD-10-CM | POA: Diagnosis not present

## 2022-04-28 DIAGNOSIS — Z8759 Personal history of other complications of pregnancy, childbirth and the puerperium: Secondary | ICD-10-CM | POA: Diagnosis not present

## 2022-04-28 DIAGNOSIS — D500 Iron deficiency anemia secondary to blood loss (chronic): Secondary | ICD-10-CM | POA: Diagnosis not present

## 2022-04-28 DIAGNOSIS — Z13220 Encounter for screening for lipoid disorders: Secondary | ICD-10-CM | POA: Diagnosis not present

## 2022-05-08 ENCOUNTER — Encounter: Payer: Self-pay | Admitting: Emergency Medicine

## 2022-05-08 ENCOUNTER — Ambulatory Visit
Admission: EM | Admit: 2022-05-08 | Discharge: 2022-05-08 | Disposition: A | Discharge status: Home / Self Care | Payer: BC Managed Care – PPO | Attending: Family Medicine | Admitting: Family Medicine

## 2022-05-08 DIAGNOSIS — R519 Headache, unspecified: Secondary | ICD-10-CM | POA: Diagnosis not present

## 2022-05-08 DIAGNOSIS — J069 Acute upper respiratory infection, unspecified: Secondary | ICD-10-CM

## 2022-05-08 DIAGNOSIS — Z1152 Encounter for screening for COVID-19: Secondary | ICD-10-CM | POA: Diagnosis not present

## 2022-05-08 LAB — RAPID INFLUENZA A&B ANTIGENS
Influenza A (ARMC): NEGATIVE
Influenza B (ARMC): NEGATIVE

## 2022-05-08 LAB — SARS CORONAVIRUS 2 BY RT PCR: SARS Coronavirus 2 by RT PCR: NEGATIVE

## 2022-05-08 LAB — GROUP A STREP BY PCR: Group A Strep by PCR: NOT DETECTED

## 2022-05-08 MED ORDER — ONDANSETRON HCL 4 MG PO TABS: 4.0000 mg | ORAL_TABLET | Freq: Four times a day (QID) | ORAL | 0 refills | Status: AC

## 2022-05-08 MED ORDER — METOCLOPRAMIDE HCL 5 MG/ML IJ SOLN: 10.0000 mg | Freq: Once | INTRAMUSCULAR | Status: DC

## 2022-05-08 MED ORDER — METOCLOPRAMIDE HCL 5 MG/ML IJ SOLN
10.0000 mg | Freq: Once | INTRAMUSCULAR | Status: AC
  Administered 2022-05-08: 10 mg via INTRAMUSCULAR

## 2022-05-08 NOTE — ED Provider Notes (Signed)
[facility]    CSN: [id number] Arrival date & time: 05/08/22  1023      History   Chief Complaint Chief Complaint  Patient presents with   Headache   Generalized Body Aches   Sore Throat   Chills    HPI Suzanne Guzman is a 32 y.o. female.   HPI   Suzanne Guzman presents for headache, sore throat, chills and body aches that started yesterday morning. She works in a daycare. Took a COVID test yesterday around 7 PM that was negative. Taking Motrin, Tylenol without relief for headache. Has some nausea but vomiting or diarrhea. She doesn't have pain with swallowing. Denies rhinorrhea or congestion. Endorses bilateral ear pain.   There is some sound and light sensitivity. No trouble moving extremities or walking.       Past Medical History:  Diagnosis Date   Anxiety    Cholestasis during pregnancy in third trimester    Hypertension    Obesity    Wolff-Parkinson-White syndrome    treated with ablation    Patient Active Problem List   Diagnosis Date Noted   Essential hypertension 03/01/2020   Fetal intolerance to labor, delivered, current hospitalization 09/19/2019   Delivery by cesarean section using transverse incision of lower segment of uterus 09/19/2019   Anxiety disorder 09/19/2019   Encounter for care or examination of lactating mother 09/17/2019   Acute respiratory failure ([place])    Seizure ([place])    Preeclampsia, third trimester 09/12/2019   Pain of left calf 07/25/2019   Gastroesophageal reflux disease     Past Surgical History:  Procedure Laterality Date   CARDIAC ELECTROPHYSIOLOGY MAPPING AND ABLATION     heart   CESAREAN SECTION N/A 09/14/2019   Procedure: CESAREAN SECTION;  Surgeon: Will Bonnet, MD;  Location: [facility] ORS;  Service: Obstetrics;  Laterality: N/A;    OB History     Gravida  1   Para  1   Term      Preterm  1   AB  0   Living  1      SAB      IAB      Ectopic      Multiple  0   Live Births  1             Home Medications    Prior to Admission medications   Medication Sig Start Date End Date Taking? Authorizing Provider  iron polysaccharides (NIFEREX) 150 MG capsule Take 1 capsule (150 mg total) by mouth daily. 09/19/19  Yes Dalia Heading, CNM  NIFEdipine (PROCARDIA XL) 60 MG 24 hr tablet Take 1 tablet (60 mg total) by mouth daily. 10/20/19  Yes Will Bonnet, MD  ondansetron (ZOFRAN) 4 MG tablet Take 1 tablet (4 mg total) by mouth every 6 (six) hours. 05/08/22  Yes Freddie Dymek, DO  escitalopram (LEXAPRO) 10 MG tablet Take 1 tablet (10 mg total) by mouth daily. 11/22/19   Will Bonnet, MD  hydrOXYzine (ATARAX/VISTARIL) 25 MG tablet Take 1 tablet (25 mg total) by mouth every 6 (six) hours as needed for itching or anxiety. 09/19/19   Dalia Heading, CNM  ibuprofen (ADVIL) 600 MG tablet Take 1 tablet (600 mg total) by mouth every 6 (six) hours as needed for fever or headache. 09/19/19   Dalia Heading, CNM  ipratropium (ATROVENT) 0.06 % nasal spray Place 2 sprays into both nostrils 4 (four) times daily. 01/12/22   Margarette Canada, NP  labetalol (NORMODYNE) 200 MG  tablet Take 1 tablet (200 mg total) by mouth daily. Take mid day between doses of 300 mg 03/01/20   Will Bonnet, MD  labetalol (NORMODYNE) 300 MG tablet Take 1 tablet (300 mg total) by mouth 2 (two) times daily. 03/01/20   Will Bonnet, MD  norethindrone (MICRONOR) 0.35 MG tablet Take 1 tablet (0.35 mg total) by mouth daily. 10/12/19   Dalia Heading, CNM  omeprazole (PRILOSEC) 20 MG capsule Take 1 capsule (20 mg total) by mouth daily. 09/19/19   Dalia Heading, CNM  oxyCODONE-acetaminophen (PERCOCET) 10-325 MG tablet Take 0.5 to 1 tablet every 6 hours prn 09/19/19   Dalia Heading, CNM  Prenatal Vit-Fe Fumarate-FA (MULTIVITAMIN-PRENATAL) 27-0.8 MG TABS tablet Take 1 tablet by mouth daily at 12 noon.    [provider]  triamcinolone (NASACORT) 55 MCG/ACT AERO nasal inhaler Place 2  sprays into the nose daily. 12/06/21   Wynona Luna, MD    Family History Family History  Problem Relation Age of Onset   Cancer Mother    Other Father        MDS    Social History Social History   Tobacco Use   Smoking status: Never   Smokeless tobacco: Never  Vaping Use   Vaping Use: Never used  Substance Use Topics   Alcohol use: Not Currently    Alcohol/week: 1.0 standard drink of alcohol    Types: 1 Glasses of wine per week    Comment: ocassionly   Drug use: Never     Allergies   Nutmeg oil (myristica oil), Other, Peanut-containing drug products, and Penicillins   Review of Systems Review of Systems: negative unless otherwise stated in HPI.      Physical Exam Triage Vital Signs ED Triage Vitals  Enc Vitals Group     BP 05/08/22 1123 125/83     Pulse Rate 05/08/22 1123 86     Resp 05/08/22 1123 18     Temp 05/08/22 1123 99.1 F (37.3 C)     Temp Source 05/08/22 1123 Oral     SpO2 05/08/22 1123 100 %     Weight --      Height --      Head Circumference --      Peak Flow --      Pain Score 05/08/22 1121 7     Pain Loc --      Pain Edu? --      Excl. in [place]? --    No data found.  Updated Vital Signs BP 125/83 (BP Location: Left Arm)   Pulse 86   Temp 99.1 F (37.3 C) (Oral)   Resp 18   LMP 04/24/2022   SpO2 100%   Visual Acuity Right Eye Distance:   Left Eye Distance:   Bilateral Distance:    Right Eye Near:   Left Eye Near:    Bilateral Near:     Physical Exam GEN:     alert, non-toxic appearing female in no distress    HENT:  mucus membranes moist, oropharyngeal without lesions or exudate, no tonsillar hypertrophy, mild oropharyngeal erythema, clear nasal discharge, EYES:   pupils equal and reactive, no scleral injection or discharge NECK:  normal ROM, no lymphadenopathy, no meningismus   RESP:  no increased work of breathing, clear to auscultation bilaterally CVS:   regular rate and rhythm NEURO:  alert, oriented, speech  normal, CN 2-12 grossly intact, no facial droop,  sensation grossly intact, strength 5/5 bilateral UE and LE,  normal coordination Skin:   warm and dry    UC Treatments / Results  Labs (all labs ordered are listed, but only abnormal results are displayed) Labs Reviewed  RAPID INFLUENZA A&B ANTIGENS  GROUP A STREP BY PCR  SARS CORONAVIRUS 2 BY RT PCR    EKG   Radiology No results found.  Procedures Procedures (including critical care time)  Medications Ordered in UC Medications  metoCLOPramide (REGLAN) injection 10 mg (10 mg Intramuscular Given 05/08/22 1223)    Initial Impression / Assessment and Plan / UC Course  I have reviewed the triage vital signs and the nursing notes.  Pertinent labs & imaging results that were available during my care of the patient were reviewed by me and considered in my medical decision making (see chart for details).       Pt is a 32 y.o. female who presents for 1-2 days of respiratory symptoms. Doyle is afebrile here without recent antipyretics. Satting well on room air. Overall pt is non-toxic appearing, well hydrated, without respiratory distress. Pulmonary exam is unremarkable.  COVID and Strep PCR testing obtained and were negative. History most consistent with viral respiratory illness. Discussed symptomatic treatment.  Explained lack of efficacy of antibiotics in viral disease.  Typical duration of symptoms discussed.   Pt has a headache with nausea, phono and photophobia.  Vital signs are stable.  Neuro exam is normal. Differential diagnosis includes but is not limited to migraine headache, tension headache, cluster headache, CVA, depression, intracranial hemorrhage, meningitis/encephalitis, idiopathic intracranial hypertension, dehydration and analgesia abuse. On chart review, there is no head imaging available.   Patient given Reglan 10 mg IM with  relief.   Treat with headache cocktail (Benadryl, antiemetic and NSAID) at home. ED and  return precautions reviewed. Advised to follow up with PCP.    Return and ED precautions given and voiced understanding. Discussed MDM, treatment plan and plan for follow-up with patient who agrees with plan.     Final Clinical Impressions(s) / UC Diagnoses   Final diagnoses:  Bad headache  Viral URI     Discharge Instructions      For you headache: Take 1 tablet benadryl, 1 tablet of zofran, 2 tablets of Tylenol with 600 mg Ibuprofen for your headache. Stop by the pharmacy to pick up your prescriptions.    We will contact you if your COVID/strep test is positive.  Please quarantine while you wait for the results.  If your test is negative you may resume normal activities.  If your test is positive please continue to quarantine for at least 5 days from your symptom onset or until you are without a fever for at least 24 hours after the medications.    If your were prescribed medication, stop by the pharmacy to pick them up.   You can take Tylenol and/or Ibuprofen as needed for fever reduction and pain relief.    For cough: honey 1/2 to 1 teaspoon (you can dilute the honey in water or another fluid).  You can also use guaifenesin and dextromethorphan for cough. You can use a humidifier for chest congestion and cough.  If you don't have a humidifier, you can sit in the bathroom with the hot shower running.      For sore throat: try warm salt water gargles, Mucinex sore throat cough drops or cepacol lozenges, throat spray, warm tea or water with lemon/honey, popsicles or ice, or OTC cold relief medicine for throat discomfort. You can also purchase chloraseptic spray  at the pharmacy or dollar store.   For congestion: take a daily anti-histamine like Zyrtec, Claritin, and a oral decongestant, such as pseudoephedrine.  You can also use Flonase 1-2 sprays in each nostril daily. Afrin is also a good option, if you do not have high blood pressure.    It is important to stay hydrated: drink  plenty of fluids (water, gatorade/powerade/pedialyte, juices, or teas) to keep your throat moisturized and help further relieve irritation/discomfort.    Return or go to the Emergency Department if symptoms worsen or do not improve in the next few days      ED Prescriptions     Medication Sig Dispense Auth. Provider   ondansetron (ZOFRAN) 4 MG tablet Take 1 tablet (4 mg total) by mouth every 6 (six) hours. 12 tablet Lyndee Hensen, DO      PDMP not reviewed this encounter.   Lyndee Hensen, DO 05/12/22 2115

## 2022-05-08 NOTE — ED Triage Notes (Signed)
Pt presents with a headache, sore throat, bodyaches and chills x 3 days. Pt took 3 at home covid test that were negative.

## 2022-05-08 NOTE — Discharge Instructions (Addendum)
For you headache: Take 1 tablet benadryl, 1 tablet of zofran, 2 tablets of Tylenol with 600 mg Ibuprofen for your headache. Stop by the pharmacy to pick up your prescriptions.    We will contact you if your COVID/strep test is positive.  Please quarantine while you wait for the results.  If your test is negative you may resume normal activities.  If your test is positive please continue to quarantine for at least 5 days from your symptom onset or until you are without a fever for at least 24 hours after the medications.    If your were prescribed medication, stop by the pharmacy to pick them up.   You can take Tylenol and/or Ibuprofen as needed for fever reduction and pain relief.    For cough: honey 1/2 to 1 teaspoon (you can dilute the honey in water or another fluid).  You can also use guaifenesin and dextromethorphan for cough. You can use a humidifier for chest congestion and cough.  If you don't have a humidifier, you can sit in the bathroom with the hot shower running.      For sore throat: try warm salt water gargles, Mucinex sore throat cough drops or cepacol lozenges, throat spray, warm tea or water with lemon/honey, popsicles or ice, or OTC cold relief medicine for throat discomfort. You can also purchase chloraseptic spray at the pharmacy or dollar store.   For congestion: take a daily anti-histamine like Zyrtec, Claritin, and a oral decongestant, such as pseudoephedrine.  You can also use Flonase 1-2 sprays in each nostril daily. Afrin is also a good option, if you do not have high blood pressure.    It is important to stay hydrated: drink plenty of fluids (water, gatorade/powerade/pedialyte, juices, or teas) to keep your throat moisturized and help further relieve irritation/discomfort.    Return or go to the Emergency Department if symptoms worsen or do not improve in the next few days

## 2022-07-02 DIAGNOSIS — N643 Galactorrhea not associated with childbirth: Secondary | ICD-10-CM | POA: Diagnosis not present

## 2022-07-10 ENCOUNTER — Ambulatory Visit: Admit: 2022-07-10 | Payer: BC Managed Care – PPO

## 2022-07-11 ENCOUNTER — Ambulatory Visit: Payer: BC Managed Care – PPO

## 2022-07-19 DIAGNOSIS — M25532 Pain in left wrist: Secondary | ICD-10-CM | POA: Diagnosis not present

## 2022-07-19 DIAGNOSIS — M778 Other enthesopathies, not elsewhere classified: Secondary | ICD-10-CM | POA: Diagnosis not present

## 2022-08-03 DIAGNOSIS — J020 Streptococcal pharyngitis: Secondary | ICD-10-CM | POA: Diagnosis not present

## 2022-10-30 DIAGNOSIS — Z880 Allergy status to penicillin: Secondary | ICD-10-CM | POA: Diagnosis not present

## 2022-10-30 DIAGNOSIS — R519 Headache, unspecified: Secondary | ICD-10-CM | POA: Diagnosis not present

## 2022-10-30 DIAGNOSIS — J45909 Unspecified asthma, uncomplicated: Secondary | ICD-10-CM | POA: Diagnosis not present

## 2022-10-30 DIAGNOSIS — M542 Cervicalgia: Secondary | ICD-10-CM | POA: Diagnosis not present

## 2022-10-30 DIAGNOSIS — M79621 Pain in right upper arm: Secondary | ICD-10-CM | POA: Diagnosis not present

## 2022-10-30 DIAGNOSIS — I10 Essential (primary) hypertension: Secondary | ICD-10-CM | POA: Diagnosis not present

## 2022-10-30 DIAGNOSIS — M25551 Pain in right hip: Secondary | ICD-10-CM | POA: Diagnosis not present

## 2022-11-02 ENCOUNTER — Ambulatory Visit
Admission: EM | Admit: 2022-11-02 | Discharge: 2022-11-02 | Disposition: A | Discharge status: Home / Self Care | Payer: BC Managed Care – PPO

## 2022-11-02 ENCOUNTER — Other Ambulatory Visit: Payer: Self-pay

## 2022-11-02 DIAGNOSIS — M79601 Pain in right arm: Secondary | ICD-10-CM

## 2022-11-02 DIAGNOSIS — T148XXA Other injury of unspecified body region, initial encounter: Secondary | ICD-10-CM | POA: Diagnosis not present

## 2022-11-02 DIAGNOSIS — M7918 Myalgia, other site: Secondary | ICD-10-CM

## 2022-11-02 NOTE — ED Triage Notes (Signed)
Pt was pushing swing today and suddenly right arm started locking up and became very painful. Pt states its her right upper arm. No blunt injury just aggressive pushing on the swing.

## 2022-11-02 NOTE — ED Provider Notes (Signed)
[facility]    CSN: [id number] Arrival date & time: 11/02/22  1749      History   Chief Complaint Chief Complaint  Patient presents with   Arm Pain    HPI Suzanne Guzman is a 32 y.o. female.   32 year old female, Suzanne Guzman, presents to urgent care for evaluation of right posterior arm.  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  Patient is right-hand dominant.  Patient has not taken any medication or tried any ice for symptom management.  Patient has full range of motion.  The history is provided by the patient. No language interpreter was used.    Past Medical History:  Diagnosis Date   Anxiety    Cholestasis during pregnancy in third trimester    Hypertension    Obesity    Wolff-Parkinson-White syndrome    treated with ablation    Patient Active Problem List   Diagnosis Date Noted   Musculoskeletal pain 11/02/2022   Muscle strain 11/02/2022   Right arm pain 11/02/2022   Essential hypertension 03/01/2020   Fetal intolerance to labor, delivered, current hospitalization 09/19/2019   Delivery by cesarean section using transverse incision of lower segment of uterus 09/19/2019   Anxiety disorder 09/19/2019   Encounter for care or examination of lactating mother 09/17/2019   Acute respiratory failure ([facility])    Seizure ([facility])    Preeclampsia, third trimester 09/12/2019   Pain of left calf 07/25/2019   Gastroesophageal reflux disease     Past Surgical History:  Procedure Laterality Date   CARDIAC ELECTROPHYSIOLOGY MAPPING AND ABLATION     heart   CESAREAN SECTION N/A 09/14/2019   Procedure: CESAREAN SECTION;  Surgeon: Conard Novak, MD;  Location: [facility] ORS;  Service: Obstetrics;  Laterality: N/A;    OB History     Gravida  1   Para  1   Term      Preterm  1   AB  0   Living  1      SAB      IAB      Ectopic       Multiple  0   Live Births  1            Home Medications    Prior to Admission medications   Medication Sig Start Date End Date Taking? Authorizing Provider  escitalopram (LEXAPRO) 10 MG tablet Take 1 tablet (10 mg total) by mouth daily. 11/22/19  Yes Conard Novak, MD  ibuprofen (ADVIL) 600 MG tablet Take 1 tablet (600 mg total) by mouth every 6 (six) hours as needed for fever or headache. 09/19/19  Yes Farrel Conners, CNM  labetalol (NORMODYNE) 200 MG tablet Take 1 tablet (200 mg total) by mouth daily. Take mid day between doses of 300 mg 03/01/20  Yes Conard Novak, MD  NIFEdipine (PROCARDIA XL) 60 MG 24 hr tablet Take 1 tablet (60 mg total) by mouth daily. 10/20/19  Yes Conard Novak, MD  Prenatal Vit-Fe Fumarate-FA (MULTIVITAMIN-PRENATAL) 27-0.8 MG TABS tablet Take 1 tablet by mouth daily at 12 noon.   Yes [provider]  triamcinolone (NASACORT) 55 MCG/ACT AERO nasal inhaler Place 2 sprays into the nose daily. 12/06/21  Yes Isa Rankin, MD  hydrOXYzine (ATARAX/VISTARIL) 25 MG tablet Take 1 tablet (25 mg total) by mouth every 6 (six) hours as needed for itching or  anxiety. 09/19/19   Farrel Conners, CNM  ipratropium (ATROVENT) 0.06 % nasal spray Place 2 sprays into both nostrils 4 (four) times daily. 01/12/22   Becky Augusta, NP  iron polysaccharides (NIFEREX) 150 MG capsule Take 1 capsule (150 mg total) by mouth daily. 09/19/19   Farrel Conners, CNM  labetalol (NORMODYNE) 300 MG tablet Take 1 tablet (300 mg total) by mouth 2 (two) times daily. 03/01/20   Conard Novak, MD  norethindrone (MICRONOR) 0.35 MG tablet Take 1 tablet (0.35 mg total) by mouth daily. 10/12/19   Farrel Conners, CNM  omeprazole (PRILOSEC) 20 MG capsule Take 1 capsule (20 mg total) by mouth daily. 09/19/19   Farrel Conners, CNM  ondansetron (ZOFRAN) 4 MG tablet Take 1 tablet (4 mg total) by mouth every 6 (six) hours. 05/08/22   Katha Cabal, DO   oxyCODONE-acetaminophen (PERCOCET) 10-325 MG tablet Take 0.5 to 1 tablet every 6 hours prn 09/19/19   Farrel Conners, CNM    Family History Family History  Problem Relation Age of Onset   Cancer Mother    Other Father        MDS    Social History Social History   Tobacco Use   Smoking status: Never   Smokeless tobacco: Never  Vaping Use   Vaping status: Never Used  Substance Use Topics   Alcohol use: Not Currently    Alcohol/week: 1.0 standard drink of alcohol    Types: 1 Glasses of wine per week    Comment: ocassionly   Drug use: Never     Allergies   Nutmeg oil (myristica oil), Other, Peanut-containing drug products, and Penicillins   Review of Systems Review of Systems  Musculoskeletal:  Positive for myalgias.  Skin: Negative.   All other systems reviewed and are negative.    Physical Exam Triage Vital Signs ED Triage Vitals  Encounter Vitals Group     BP      Systolic BP Percentile      Diastolic BP Percentile      Pulse      Resp      Temp      Temp src      SpO2      Weight      Height      Head Circumference      Peak Flow      Pain Score      Pain Loc      Pain Education      Exclude from Growth Chart    No data found.  Updated Vital Signs BP (!) 143/79 (BP Location: Left Arm)   Pulse 77   Temp 99.1 F (37.3 C)   Resp 18   LMP 10/20/2022   SpO2 98%   Visual Acuity Right Eye Distance:   Left Eye Distance:   Bilateral Distance:    Right Eye Near:   Left Eye Near:    Bilateral Near:     Physical Exam Vitals and nursing note reviewed.  Musculoskeletal:     Right upper arm: Normal.       Arms:     Comments: Area of pain, strength is 5/5, MAEWx4  Neurological:     General: No focal deficit present.     Mental Status: She is alert and oriented to person, place, and time.     GCS: GCS eye subscore is 4. GCS verbal subscore is 5. GCS motor subscore is 6.     Cranial Nerves: No cranial nerve deficit.  Sensory: No  sensory deficit.  Psychiatric:        Attention and Perception: Attention normal.        Mood and Affect: Mood normal.        Speech: Speech normal.        Behavior: Behavior normal.      UC Treatments / Results  Labs (all labs ordered are listed, but only abnormal results are displayed) Labs Reviewed - No data to display  EKG   Radiology No results found.  Procedures Procedures (including critical care time)  Medications Ordered in UC Medications - No data to display  Initial Impression / Assessment and Plan / UC Course  I have reviewed the triage vital signs and the nursing notes.  Pertinent labs & imaging results that were available during my care of the patient were reviewed by me and considered in my medical decision making (see chart for details).     Ddx: Musculoskeletal pain, muscle strain, right arm pain Final Clinical Impressions(s) / UC Diagnoses   Final diagnoses:  Musculoskeletal pain  Muscle strain  Right arm pain     Discharge Instructions      Rest,ice, may take tylenol as label directed for pain. May use over the counter lidocaine or biofreeze to area.   Follow up with Orthopedics if pain persists  Return as needed.     ED Prescriptions   None    PDMP not reviewed this encounter.   Clancy Gourd, NP 11/02/22 [phone]

## 2022-11-02 NOTE — Discharge Instructions (Addendum)
Rest,ice, may take tylenol as label directed for pain. May use over the counter lidocaine or biofreeze to area.   Follow up with Orthopedics if pain persists  Return as needed.

## 2022-11-03 DIAGNOSIS — M79602 Pain in left arm: Secondary | ICD-10-CM | POA: Diagnosis not present

## 2022-11-23 ENCOUNTER — Ambulatory Visit: Payer: BC Managed Care – PPO | Admitting: Dermatology

## 2023-04-19 DIAGNOSIS — R102 Pelvic and perineal pain: Secondary | ICD-10-CM | POA: Diagnosis not present

## 2023-04-19 DIAGNOSIS — R0981 Nasal congestion: Secondary | ICD-10-CM | POA: Diagnosis not present

## 2023-04-19 DIAGNOSIS — I10 Essential (primary) hypertension: Secondary | ICD-10-CM | POA: Diagnosis not present

## 2023-04-19 DIAGNOSIS — Z6830 Body mass index (BMI) 30.0-30.9, adult: Secondary | ICD-10-CM | POA: Diagnosis not present

## 2023-04-19 DIAGNOSIS — Z8759 Personal history of other complications of pregnancy, childbirth and the puerperium: Secondary | ICD-10-CM | POA: Diagnosis not present

## 2023-04-19 DIAGNOSIS — E66811 Obesity, class 1: Secondary | ICD-10-CM | POA: Diagnosis not present

## 2023-04-19 DIAGNOSIS — F419 Anxiety disorder, unspecified: Secondary | ICD-10-CM | POA: Diagnosis not present

## 2023-04-19 DIAGNOSIS — R300 Dysuria: Secondary | ICD-10-CM | POA: Diagnosis not present

## 2023-04-19 DIAGNOSIS — I456 Pre-excitation syndrome: Secondary | ICD-10-CM | POA: Diagnosis not present

## 2023-04-19 DIAGNOSIS — L819 Disorder of pigmentation, unspecified: Secondary | ICD-10-CM | POA: Diagnosis not present

## 2023-05-07 ENCOUNTER — Encounter: Payer: Self-pay | Admitting: Emergency Medicine

## 2023-05-07 ENCOUNTER — Ambulatory Visit
Admission: EM | Admit: 2023-05-07 | Discharge: 2023-05-07 | Disposition: A | Discharge status: Home / Self Care | Payer: BC Managed Care – PPO | Attending: Emergency Medicine | Admitting: Emergency Medicine

## 2023-05-07 DIAGNOSIS — H6123 Impacted cerumen, bilateral: Secondary | ICD-10-CM | POA: Insufficient documentation

## 2023-05-07 DIAGNOSIS — H6121 Impacted cerumen, right ear: Secondary | ICD-10-CM | POA: Diagnosis not present

## 2023-05-07 DIAGNOSIS — H66003 Acute suppurative otitis media without spontaneous rupture of ear drum, bilateral: Secondary | ICD-10-CM | POA: Insufficient documentation

## 2023-05-07 DIAGNOSIS — J069 Acute upper respiratory infection, unspecified: Secondary | ICD-10-CM | POA: Insufficient documentation

## 2023-05-07 LAB — RESP PANEL BY RT-PCR (FLU A&B, COVID) ARPGX2
Influenza A by PCR: NEGATIVE
Influenza B by PCR: NEGATIVE
SARS Coronavirus 2 by RT PCR: NEGATIVE

## 2023-05-07 MED ORDER — BENZONATATE 100 MG PO CAPS: 200.0000 mg | ORAL_CAPSULE | Freq: Three times a day (TID) | ORAL | 0 refills | Status: AC

## 2023-05-07 MED ORDER — AZITHROMYCIN 250 MG PO TABS: 250.0000 mg | ORAL_TABLET | Freq: Every day | ORAL | 0 refills | Status: AC

## 2023-05-07 MED ORDER — IPRATROPIUM BROMIDE 0.06 % NA SOLN: 2.0000 | Freq: Four times a day (QID) | NASAL | 12 refills | Status: AC

## 2023-05-07 MED ORDER — PROMETHAZINE-DM 6.25-15 MG/5ML PO SYRP: 5.0000 mL | ORAL_SOLUTION | Freq: Four times a day (QID) | ORAL | 0 refills | Status: AC | PRN

## 2023-05-07 NOTE — ED Triage Notes (Signed)
 Sx x 2 days Bilateral ear pain Sinus pressure Stuffy nose

## 2023-05-07 NOTE — Discharge Instructions (Addendum)
Take the Azithromycin daily for 5 days with food for treatment of your ear infection. ? ?Take an over-the-counter probiotic 1 hour after each dose of antibiotic to prevent diarrhea. ? ?Use over-the-counter Tylenol and ibuprofen as needed for pain or fever. ? ?Place a hot water bottle, or heating pad, underneath your pillowcase at night to help dilate up your ear and aid in pain relief as well as resolution of the infection. ? ?Use the Atrovent nasal spray, 2 squirts in each nostril every 6 hours, as needed for runny nose and postnasal drip. ? ?Use the Tessalon Perles every 8 hours during the day.  Take them with a small sip of water.  They may give you some numbness to the base of your tongue or a metallic taste in your mouth, this is normal. ? ?Use the Promethazine DM cough syrup at bedtime for cough and congestion.  It will make you drowsy so do not take it during the day. ? ?Return for reevaluation or see your primary care provider for any new or worsening symptoms.   ?

## 2023-05-07 NOTE — ED Provider Notes (Signed)
 [facility]    CSN: [id number] Arrival date & time: 05/07/23  0805      History   Chief Complaint Chief Complaint  Patient presents with   Otalgia   Facial Pain   Nasal Congestion    HPI Suzanne Guzman is a 33 y.o. female.   HPI  33 year old female with a past medical history significant for WPW, hypertension, anxiety, and obesity presents for evaluation of respiratory symptoms that began 2 days ago and consist of a fever of over 101 yesterday that responded to Tylenol, nasal congestion, bilateral ear pain, sore throat, headache, body aches, and sinus pressure.  She denies any nasal discharge, cough, GI symptoms, known sick contacts, or recent travel.  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  Past Medical History:  Diagnosis Date   Anxiety    Cholestasis during pregnancy in third trimester    Hypertension    Obesity    Wolff-Parkinson-White syndrome    treated with ablation    Patient Active Problem List   Diagnosis Date Noted   Musculoskeletal pain 11/02/2022   Muscle strain 11/02/2022   Right arm pain 11/02/2022   Essential hypertension 03/01/2020   Fetal intolerance to labor, delivered, current hospitalization 09/19/2019   Delivery by cesarean section using transverse incision of lower segment of uterus 09/19/2019   Anxiety disorder 09/19/2019   Encounter for care or examination of lactating mother 09/17/2019   Acute respiratory failure (HCC)    Seizure (HCC)    Preeclampsia, third trimester 09/12/2019   Pain of left calf 07/25/2019   Gastroesophageal reflux disease     Past Surgical History:  Procedure Laterality Date   CARDIAC ELECTROPHYSIOLOGY MAPPING AND ABLATION     heart   CESAREAN SECTION N/A 09/14/2019   Procedure: CESAREAN SECTION;  Surgeon: Conard Novak, MD;  Location: [facility] ORS;  Service: Obstetrics;  Laterality: N/A;    OB History     Gravida  1   Para  1   Term      Preterm  1   AB  0    Living  1      SAB      IAB      Ectopic      Multiple  0   Live Births  1            Home Medications    Prior to Admission medications   Medication Sig Start Date End Date Taking? Authorizing Provider  azithromycin (ZITHROMAX Z-PAK) 250 MG tablet Take 1 tablet (250 mg total) by mouth daily. Take 2 tablets on the first day and then 1 tablet daily thereafter for a total of 5 days of treatment. 05/07/23  Yes Becky Augusta, NP  benzonatate (TESSALON) 100 MG capsule Take 2 capsules (200 mg total) by mouth every 8 (eight) hours. 05/07/23  Yes Becky Augusta, NP  buPROPion (WELLBUTRIN XL) 150 MG 24 hr tablet Take 150 mg by mouth daily. 04/27/23  Yes [provider]  FLUoxetine (PROZAC) 20 MG capsule Take 20 mg by mouth every morning. 04/03/23  Yes [provider]  labetalol (NORMODYNE) 300 MG tablet Take 1 tablet (300 mg total) by mouth 2 (two) times daily. 03/01/20  Yes Conard Novak, MD  NIFEdipine (PROCARDIA XL) 60 MG 24 hr tablet Take 1 tablet (60 mg total) by mouth daily. 10/20/19  Yes Conard Novak, MD  promethazine-dextromethorphan (PROMETHAZINE-DM) 6.25-15 MG/5ML syrup Take 5 mLs by mouth 4 (four) times daily  as needed. 05/07/23  Yes Becky Augusta, NP  WEGOVY 0.25 MG/0.5ML SOAJ SMARTSIG:0.25 Milligram(s) SUB-Q Once a Week 04/25/23  Yes [provider]  escitalopram (LEXAPRO) 10 MG tablet Take 1 tablet (10 mg total) by mouth daily. 11/22/19   Conard Novak, MD  hydrOXYzine (ATARAX/VISTARIL) 25 MG tablet Take 1 tablet (25 mg total) by mouth every 6 (six) hours as needed for itching or anxiety. 09/19/19   Farrel Conners, CNM  ibuprofen (ADVIL) 600 MG tablet Take 1 tablet (600 mg total) by mouth every 6 (six) hours as needed for fever or headache. 09/19/19   Farrel Conners, CNM  ipratropium (ATROVENT) 0.06 % nasal spray Place 2 sprays into both nostrils 4 (four) times daily. 05/07/23   Becky Augusta, NP  iron polysaccharides (NIFEREX) 150 MG capsule Take  1 capsule (150 mg total) by mouth daily. 09/19/19   Farrel Conners, CNM  labetalol (NORMODYNE) 200 MG tablet Take 1 tablet (200 mg total) by mouth daily. Take mid day between doses of 300 mg 03/01/20   Conard Novak, MD  norethindrone (MICRONOR) 0.35 MG tablet Take 1 tablet (0.35 mg total) by mouth daily. 10/12/19   Farrel Conners, CNM  omeprazole (PRILOSEC) 20 MG capsule Take 1 capsule (20 mg total) by mouth daily. 09/19/19   Farrel Conners, CNM  ondansetron (ZOFRAN) 4 MG tablet Take 1 tablet (4 mg total) by mouth every 6 (six) hours. 05/08/22   Katha Cabal, DO  oxyCODONE-acetaminophen (PERCOCET) 10-325 MG tablet Take 0.5 to 1 tablet every 6 hours prn 09/19/19   Farrel Conners, CNM  Prenatal Vit-Fe Fumarate-FA (MULTIVITAMIN-PRENATAL) 27-0.8 MG TABS tablet Take 1 tablet by mouth daily at 12 noon.    [provider]  triamcinolone (NASACORT) 55 MCG/ACT AERO nasal inhaler Place 2 sprays into the nose daily. 12/06/21   Isa Rankin, MD    Family History Family History  Problem Relation Age of Onset   Cancer Mother    Other Father        MDS    Social History Social History   Tobacco Use   Smoking status: Never    Passive exposure: Never   Smokeless tobacco: Never  Vaping Use   Vaping status: Never Used  Substance Use Topics   Alcohol use: Not Currently    Alcohol/week: 1.0 standard drink of alcohol    Types: 1 Glasses of wine per week    Comment: ocassionly   Drug use: Never     Allergies   Nutmeg oil (myristica oil), Other, Peanut-containing drug products, and Penicillins   Review of Systems Review of Systems  Constitutional:  Positive for fever.  HENT:  Positive for congestion, ear pain and sore throat. Negative for rhinorrhea.   Respiratory:  Negative for cough, shortness of breath and wheezing.   Gastrointestinal:  Negative for diarrhea, nausea and vomiting.  Musculoskeletal:  Positive for arthralgias and myalgias.  Skin:  Negative for  rash.  Neurological:  Positive for headaches.     Physical Exam Triage Vital Signs ED Triage Vitals  Encounter Vitals Group     BP      Systolic BP Percentile      Diastolic BP Percentile      Pulse      Resp      Temp      Temp src      SpO2      Weight      Height      Head Circumference  Peak Flow      Pain Score      Pain Loc      Pain Education      Exclude from Growth Chart    No data found.  Updated Vital Signs BP 111/73 (BP Location: Right Arm)   Pulse 90   Temp 98 F (36.7 C) (Oral)   Resp 17   SpO2 98%   Visual Acuity Right Eye Distance:   Left Eye Distance:   Bilateral Distance:    Right Eye Near:   Left Eye Near:    Bilateral Near:     Physical Exam Vitals and nursing note reviewed.  Constitutional:      Appearance: Normal appearance. She is not ill-appearing.  HENT:     Head: Normocephalic and atraumatic.     Right Ear: There is impacted cerumen.     Left Ear: There is impacted cerumen.     Ears:     Comments: Bilateral external auditory canals are occluded by cerumen.    Nose: Congestion and rhinorrhea present.     Comments: Nasal mucosa is erythematous and edematous with thick yellow-green discharge in both nares.    Mouth/Throat:     Mouth: Mucous membranes are moist.     Pharynx: Oropharynx is clear. No oropharyngeal exudate or posterior oropharyngeal erythema.  Cardiovascular:     Rate and Rhythm: Normal rate and regular rhythm.     Pulses: Normal pulses.     Heart sounds: Normal heart sounds. No murmur heard.    No friction rub. No gallop.  Pulmonary:     Effort: Pulmonary effort is normal.     Breath sounds: Normal breath sounds. No wheezing, rhonchi or rales.  Musculoskeletal:     Cervical back: Normal range of motion and neck supple. No tenderness.  Lymphadenopathy:     Cervical: No cervical adenopathy.  Skin:    General: Skin is warm and dry.     Capillary Refill: Capillary refill takes less than 2 seconds.      Findings: No erythema or rash.  Neurological:     General: No focal deficit present.     Mental Status: She is alert and oriented to person, place, and time.      UC Treatments / Results  Labs (all labs ordered are listed, but only abnormal results are displayed) Labs Reviewed  RESP PANEL BY RT-PCR (FLU A&B, COVID) [id number]    EKG   Radiology No results found.  Procedures Procedures (including critical care time)  Medications Ordered in UC Medications - No data to display  Initial Impression / Assessment and Plan / UC Course  I have reviewed the triage vital signs and the nursing notes.  Pertinent labs & imaging results that were available during my care of the patient were reviewed by me and considered in my medical decision making (see chart for details).   Patient is a nontoxic-appearing 33 year old female presenting for evaluation of respiratory symptoms as outlined HPI above.  She is complaining of bilateral ear pain but both external auditory canals are occluded by cerumen.  I will order an ear lavage and reassess.  Remainder of her physical exam does reveal inflammation of her upper respiratory tract including nasal mucosa with thick yellow-green discharge.  Oropharyngeal exam is benign.  Cardiopulmonary exam reveals clear lung sounds in all fields.  Differential diagnosis include COVID, influenza, viral respiratory illness.  I will order a COVID and flu PCR.  Both ears are reevaluated following ear lavage  which removed the wax.  There is a slight amount of brown cerumen in the floor of the right EAC though the tympanic membranes are clearly visible bilaterally.  Both TMs are erythematous and injected with loss of landmarks.  Respiratory panel is negative for COVID or influenza.  I will discharge patient home with diagnosis of URI with cough and congestion and with bilateral otitis media.  Due to an anaphylaxis reaction to penicillin I will start her on azithromycin once  daily for 5 days for treatment of her otitis media.  I will also prescribe Atrovent nasal spray for nasal congestion and Tessalon Perles and promethazine cough syrup for cough and congestion.  She may use over-the-counter Tylenol and/or ibuprofen as needed for fever or pain.   Final Clinical Impressions(s) / UC Diagnoses   Final diagnoses:  URI with cough and congestion  Non-recurrent acute suppurative otitis media of both ears without spontaneous rupture of tympanic membranes  Bilateral impacted cerumen     Discharge Instructions      Take the Azithromycin daily for 5 days with food for treatment of your ear infection.  Take an over-the-counter probiotic 1 hour after each dose of antibiotic to prevent diarrhea.  Use over-the-counter Tylenol and ibuprofen as needed for pain or fever.  Place a hot water bottle, or heating pad, underneath your pillowcase at night to help dilate up your ear and aid in pain relief as well as resolution of the infection.  Use the Atrovent nasal spray, 2 squirts in each nostril every 6 hours, as needed for runny nose and postnasal drip.  Use the Tessalon Perles every 8 hours during the day.  Take them with a small sip of water.  They may give you some numbness to the base of your tongue or a metallic taste in your mouth, this is normal.  Use the Promethazine DM cough syrup at bedtime for cough and congestion.  It will make you drowsy so do not take it during the day.  Return for reevaluation or see your primary care provider for any new or worsening symptoms.      ED Prescriptions     Medication Sig Dispense Auth. Provider   azithromycin (ZITHROMAX Z-PAK) 250 MG tablet Take 1 tablet (250 mg total) by mouth daily. Take 2 tablets on the first day and then 1 tablet daily thereafter for a total of 5 days of treatment. 6 tablet Becky Augusta, NP   ipratropium (ATROVENT) 0.06 % nasal spray Place 2 sprays into both nostrils 4 (four) times daily. 15 mL Becky Augusta, NP   benzonatate (TESSALON) 100 MG capsule Take 2 capsules (200 mg total) by mouth every 8 (eight) hours. 21 capsule Becky Augusta, NP   promethazine-dextromethorphan (PROMETHAZINE-DM) 6.25-15 MG/5ML syrup Take 5 mLs by mouth 4 (four) times daily as needed. 118 mL Becky Augusta, NP      PDMP not reviewed this encounter.   Becky Augusta, NP 05/07/23 [phone]

## 2023-06-27 DIAGNOSIS — R102 Pelvic and perineal pain: Secondary | ICD-10-CM | POA: Diagnosis not present

## 2023-06-28 DIAGNOSIS — B9689 Other specified bacterial agents as the cause of diseases classified elsewhere: Secondary | ICD-10-CM | POA: Diagnosis not present

## 2023-06-28 DIAGNOSIS — L300 Nummular dermatitis: Secondary | ICD-10-CM | POA: Diagnosis not present

## 2023-06-28 DIAGNOSIS — I10 Essential (primary) hypertension: Secondary | ICD-10-CM | POA: Diagnosis not present

## 2023-06-28 DIAGNOSIS — Z6830 Body mass index (BMI) 30.0-30.9, adult: Secondary | ICD-10-CM | POA: Diagnosis not present

## 2023-06-28 DIAGNOSIS — J019 Acute sinusitis, unspecified: Secondary | ICD-10-CM | POA: Diagnosis not present

## 2023-06-28 DIAGNOSIS — E66811 Obesity, class 1: Secondary | ICD-10-CM | POA: Diagnosis not present

## 2023-08-09 DIAGNOSIS — R42 Dizziness and giddiness: Secondary | ICD-10-CM | POA: Diagnosis not present

## 2023-11-13 DIAGNOSIS — Z880 Allergy status to penicillin: Secondary | ICD-10-CM | POA: Diagnosis not present

## 2023-11-13 DIAGNOSIS — J45909 Unspecified asthma, uncomplicated: Secondary | ICD-10-CM | POA: Diagnosis not present

## 2023-11-13 DIAGNOSIS — N83209 Unspecified ovarian cyst, unspecified side: Secondary | ICD-10-CM | POA: Diagnosis not present

## 2023-11-13 DIAGNOSIS — R102 Pelvic and perineal pain: Secondary | ICD-10-CM | POA: Diagnosis not present

## 2023-11-13 DIAGNOSIS — T148XXA Other injury of unspecified body region, initial encounter: Secondary | ICD-10-CM | POA: Diagnosis not present

## 2023-11-13 DIAGNOSIS — R4589 Other symptoms and signs involving emotional state: Secondary | ICD-10-CM | POA: Diagnosis not present

## 2023-11-13 DIAGNOSIS — R300 Dysuria: Secondary | ICD-10-CM | POA: Diagnosis not present

## 2023-11-13 DIAGNOSIS — I10 Essential (primary) hypertension: Secondary | ICD-10-CM | POA: Diagnosis not present

## 2023-12-15 DIAGNOSIS — K047 Periapical abscess without sinus: Secondary | ICD-10-CM | POA: Diagnosis not present

## 2024-02-21 IMAGING — DX DG FINGER THUMB 2+V*L*
3 series · 3 of 3 positions shown · non-contrast
Comparison: None Available.

CLINICAL DATA: Laceration to the thumb, initial encounter

EXAM:
LEFT THUMB 2+V

[finger obl]
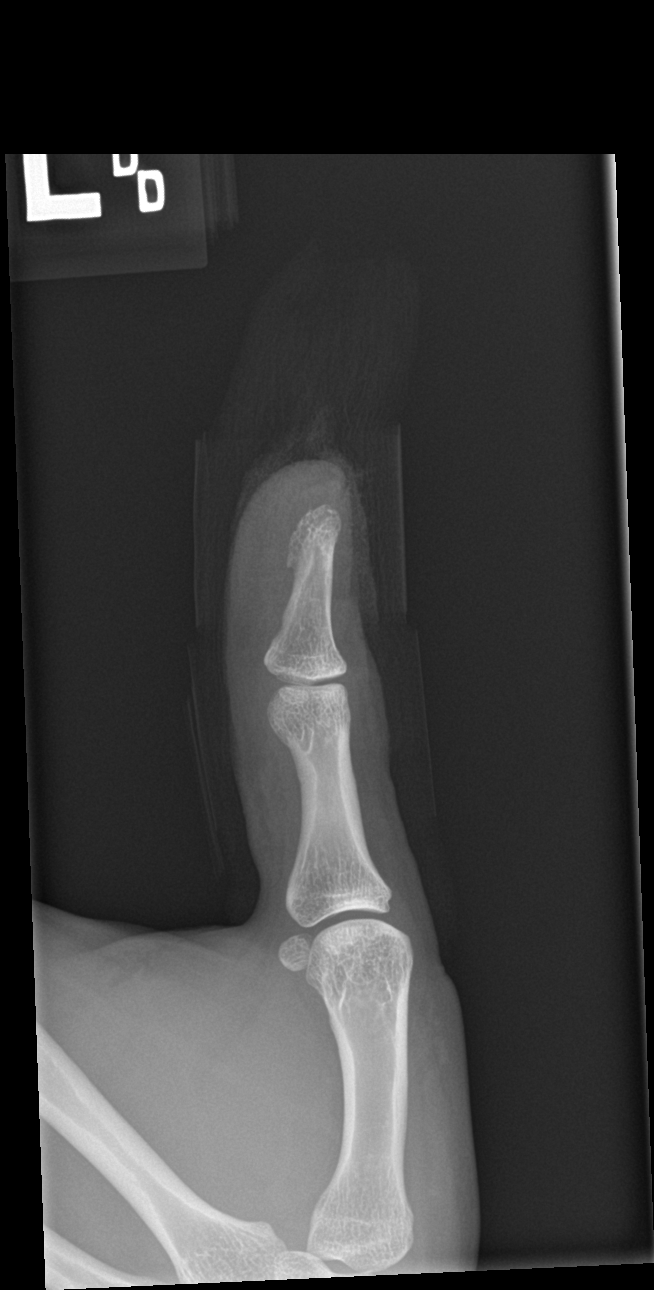

[finger lat]
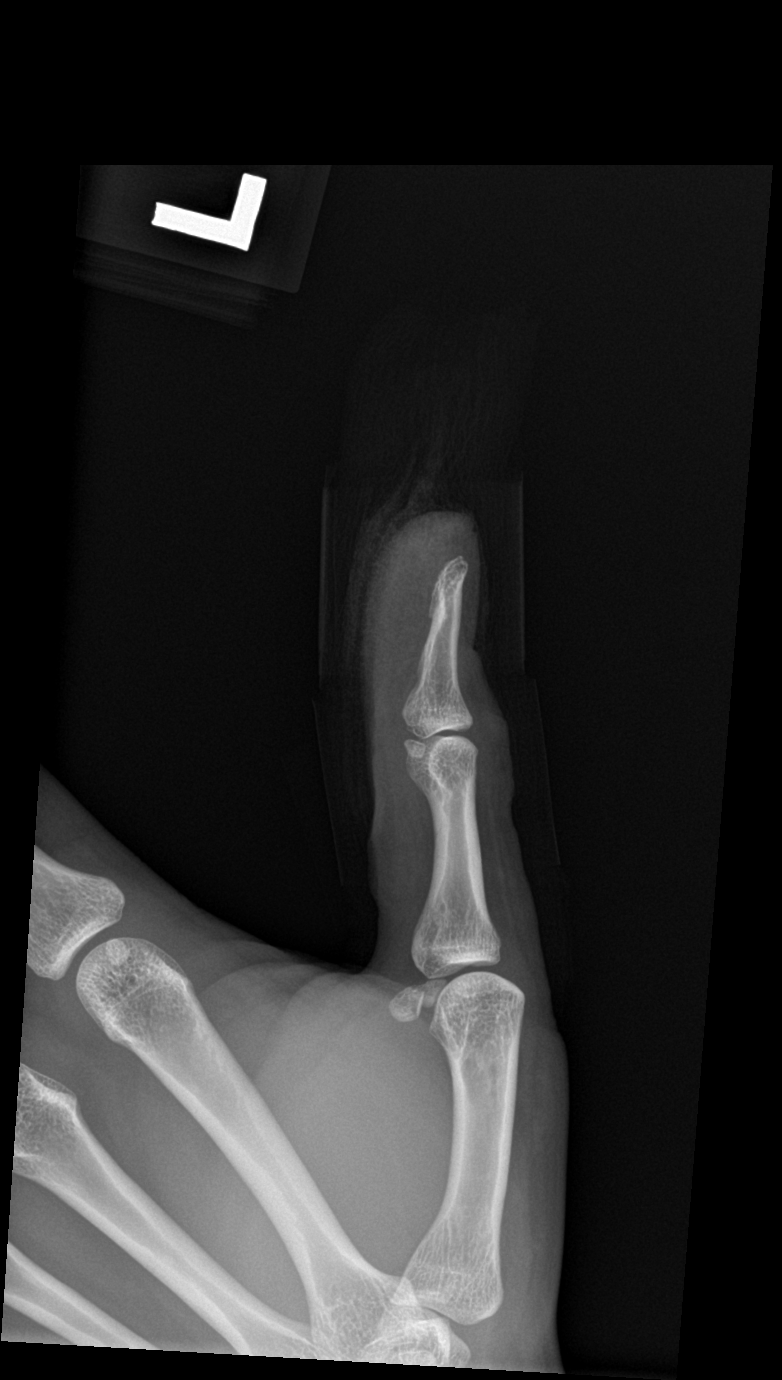

[finger ap]
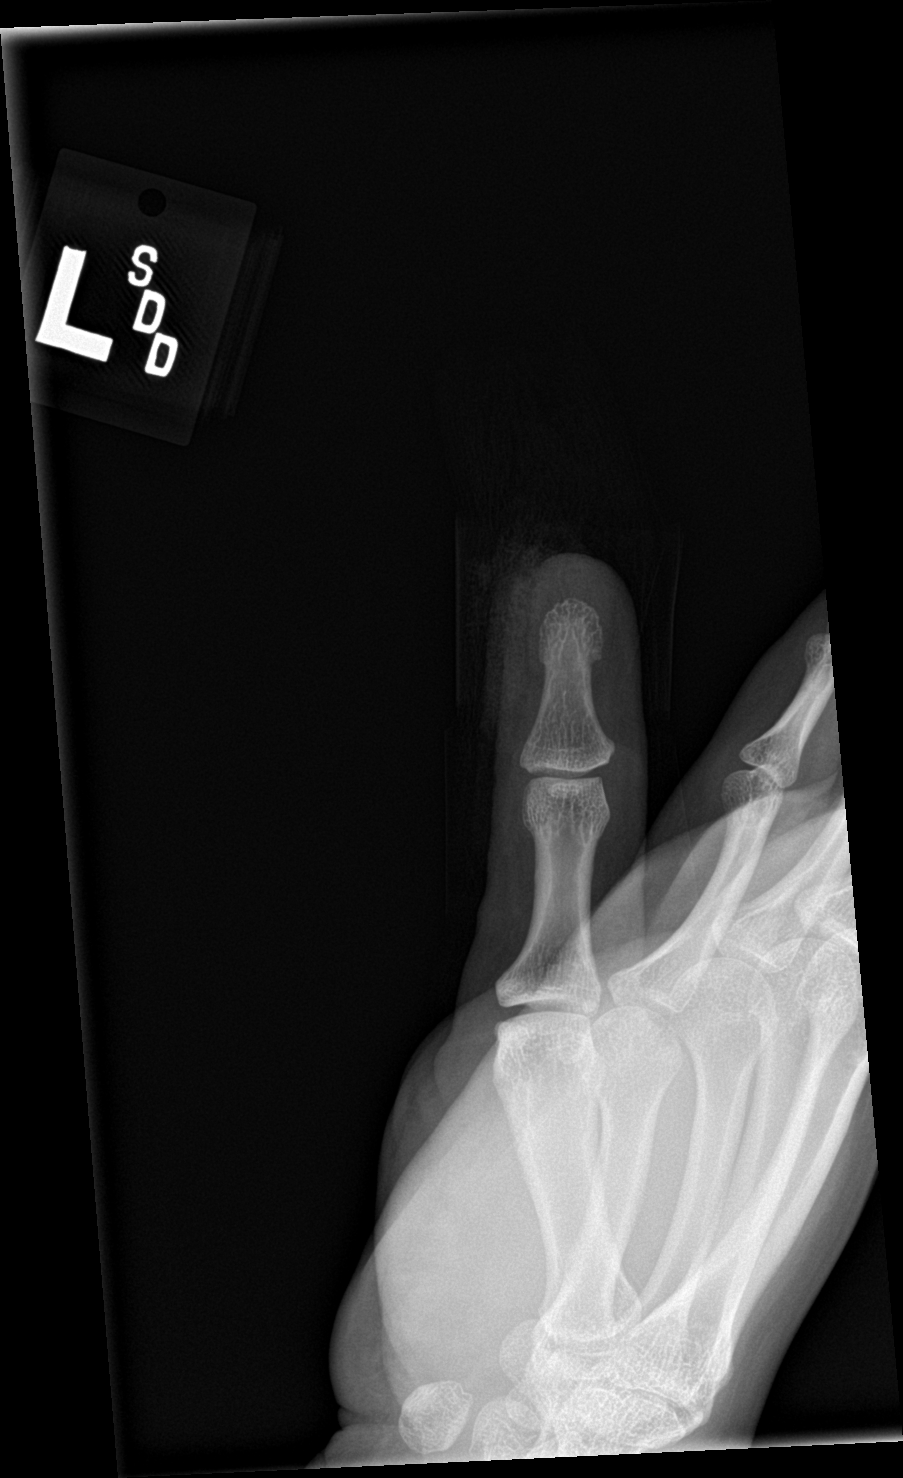

[3 of 3 positions shown; findings below may reference images not displayed]

FINDINGS: Soft tissue irregularity is noted consistent with the recent
laceration. No acute fracture is seen. No radiopaque foreign body is
noted.
IMPRESSION: Soft tissue injury without acute bony abnormality.
